# Patient Record
Sex: Female | Born: 1960 | Hispanic: No | Marital: Married | Smoking: Never smoker
Health system: Southern US, Community
[De-identification: ages and names within clinical notes are randomized; demographics above are authoritative.]

## PROBLEM LIST (undated history)

## (undated) DIAGNOSIS — I10 Essential (primary) hypertension: Secondary | ICD-10-CM

## (undated) DIAGNOSIS — E119 Type 2 diabetes mellitus without complications: Secondary | ICD-10-CM

## (undated) DIAGNOSIS — R350 Frequency of micturition: Secondary | ICD-10-CM

## (undated) DIAGNOSIS — F32A Depression, unspecified: Secondary | ICD-10-CM

## (undated) DIAGNOSIS — F329 Major depressive disorder, single episode, unspecified: Secondary | ICD-10-CM

## (undated) DIAGNOSIS — E785 Hyperlipidemia, unspecified: Secondary | ICD-10-CM

## (undated) DIAGNOSIS — M1990 Unspecified osteoarthritis, unspecified site: Secondary | ICD-10-CM

## (undated) HISTORY — DX: Essential (primary) hypertension: I10

## (undated) HISTORY — DX: Frequency of micturition: R35.0

## (undated) HISTORY — DX: Type 2 diabetes mellitus without complications: E11.9

## (undated) HISTORY — DX: Hyperlipidemia, unspecified: E78.5

## (undated) HISTORY — DX: Unspecified osteoarthritis, unspecified site: M19.90

## (undated) HISTORY — DX: Major depressive disorder, single episode, unspecified: F32.9

## (undated) HISTORY — DX: Depression, unspecified: F32.A

---

## 2008-01-06 HISTORY — PX: HERNIA REPAIR: SHX51

## 2009-03-08 ENCOUNTER — Emergency Department: Payer: Self-pay | Admitting: Emergency Medicine

## 2013-05-24 ENCOUNTER — Ambulatory Visit: Payer: Self-pay | Admitting: Internal Medicine

## 2015-07-03 ENCOUNTER — Ambulatory Visit (INDEPENDENT_AMBULATORY_CARE_PROVIDER_SITE_OTHER): Payer: BLUE CROSS/BLUE SHIELD | Admitting: Urology

## 2015-07-03 ENCOUNTER — Encounter: Payer: Self-pay | Admitting: Urology

## 2015-07-03 VITALS — BP 127/87 | HR 109 | Ht 63.0 in | Wt 199.0 lb

## 2015-07-03 DIAGNOSIS — R350 Frequency of micturition: Secondary | ICD-10-CM

## 2015-07-03 DIAGNOSIS — R351 Nocturia: Secondary | ICD-10-CM

## 2015-07-03 LAB — BLADDER SCAN AMB NON-IMAGING

## 2015-07-03 NOTE — Progress Notes (Signed)
07/03/2015 11:42 AM   Sydney Torres 07-Apr-1960 [id number]  Referring provider: Margaretann LovelessNeelam S Nadeau, MD [address] [place], [place] [address]  Chief Complaint  Patient presents with  . Urinary Frequency    New Patient    HPI: Patient is a 55 year old [place] female who presents today as a referral from her primary care physician, Sydney Torres for urinary frequency and nocturia.  Patient states that for the last 15-20 years she has suffered with urinary frequency and nocturia. She states her frequency is so severe that she urinates every 15-30 minutes. Her nocturia is so severe that she is urinating every 30 minutes during the night. She is not experiencing urinary incontinence. She does not have a history of urinary tract infections, gross hematuria or urinary stones. She states that in the remote past that she is had her bladder stretched with Dr. Renda Torres.  UA and urine cultures have been negative through her primary care's office.  She has not tried any treatment modalities other than the bladder stretching 15 years ago for this symptomatology. Her PVR today is 14 mL. She is not had dysuria or suprapubic pain. She has not had recent fevers, chills, nausea or vomiting.  She is a diabetic and her most recent hemoglobin A1c was 7%.  She does not consume caffeinated beverages. She states that she drinks mostly water.   PMH: Past Medical History  Diagnosis Date  . Arthritis   . Depression   . Diabetes (HCC)   . Hypertension   . Hyperlipemia     Surgical History: Past Surgical History  Procedure Laterality Date  . Hernia repair  2010    Home Medications:    Medication List       This list is accurate as of: 07/03/15 11:42 AM.  Always use your most recent med list.               enalapril 10 MG tablet  Commonly known as:  VASOTEC  Take 10 mg by mouth daily.     gemfibrozil 600 MG tablet  Commonly known as:  LOPID  Take 600 mg by mouth 2 (two) times daily before a meal.     meloxicam 15 MG tablet  Commonly known as:  MOBIC  Take 15 mg by mouth daily.     metFORMIN 500 MG (MOD) 24 hr tablet  Commonly known as:  GLUMETZA  Take 500 mg by mouth daily with breakfast.        Allergies: No Known Allergies  Family History: Family History  Problem Relation Age of Onset  . Kidney cancer Neg Hx   . Prostate cancer Neg Hx   . Kidney failure Father   . Kidney failure Mother   . Kidney failure Brother     Social History:  reports that she has never smoked. She does not have any smokeless tobacco history on file. She reports that she does not drink alcohol or use illicit drugs.  ROS: UROLOGY Frequent Urination?: Yes Hard to postpone urination?: No Burning/pain with urination?: No Get up at night to urinate?: Yes Leakage of urine?: No Urine stream starts and stops?: No Trouble starting stream?: No Do you have to strain to urinate?: No Blood in urine?: No Urinary tract infection?: No Sexually transmitted disease?: No Injury to kidneys or bladder?: No Painful intercourse?: No Weak stream?: No Currently pregnant?: No Vaginal bleeding?: No Last menstrual period?: n  Gastrointestinal Nausea?: No Vomiting?: Yes Indigestion/heartburn?: No Diarrhea?: No Constipation?: No  Constitutional  Fever: Yes Night sweats?: No Weight loss?: No Fatigue?: No  Skin Skin rash/lesions?: No Itching?: Yes  Eyes Blurred vision?: No Double vision?: No  Ears/Nose/Throat Sore throat?: No Sinus problems?: No  Hematologic/Lymphatic Swollen glands?: No Easy bruising?: No  Cardiovascular Leg swelling?: No Chest pain?: No  Respiratory Cough?: No Shortness of breath?: No  Endocrine Excessive thirst?: No  Musculoskeletal Back pain?: Yes Joint pain?: Yes  Neurological Headaches?: No Dizziness?: No  Psychologic Depression?: Yes Anxiety?: No  Physical Exam: BP 127/87 mmHg  Pulse 109  Ht 5\' 3"  (1.6 m)  Wt 199 lb (90.266 kg)  BMI 35.26 kg/m2    Constitutional: Well nourished. Alert and oriented, No acute distress. HEENT: [place] AT, moist mucus membranes. Trachea midline, no masses. Cardiovascular: No clubbing, cyanosis, or edema. Respiratory: Normal respiratory effort, no increased work of breathing. GI: Abdomen is soft, non tender, non distended, no abdominal masses. Liver and spleen not palpable.  No hernias appreciated.  Stool sample for occult testing is not indicated.   GU: No CVA tenderness.  No bladder fullness or masses.  Normal external genitalia, normal pubic hair distribution, no lesions.  Normal urethral meatus, no lesions, no prolapse, no discharge.   No urethral masses, tenderness and/or tenderness. No bladder fullness, tenderness or masses. Normal vagina mucosa, good estrogen effect, no discharge, no lesions, good pelvic support, no cystocele or rectocele noted.  No cervical motion tenderness.  Uterus is freely mobile and non-fixed.  No adnexal/parametria masses or tenderness noted.  Anus and perineum are without rashes or lesions.   Skin: No rashes, bruises or suspicious lesions. Lymph: No cervical or inguinal adenopathy. Neurologic: Grossly intact, no focal deficits, moving all 4 extremities. Psychiatric: Normal mood and affect.   Pertinent Imaging: Results for Sydney Torres, Sydney Torres (MRN [id number]) as of 07/03/2015 11:41  Ref. Range 07/03/2015 11:23  Scan Result Unknown 14ml    Assessment & Plan:    1. Urinary frequency:   Patient was offered behavioral therapies; bladder training, bladder control strategies, pelvic floor muscle training and fluid management.  We discussed PTNS, Botox and InterStim.  We also discussed anticholinergic therapy and beta-3 andrenoceptor agonist and the potential side effects of each therapy.   She would like to try the beta-3 andrenoceptor (Myrbetriq).  I have given her Myrbetriq 50 mg samples, #28.  I have reviewed with the patient of the side effects of Myrbetriq, such as: elevation in BP, urinary  retention and/or HA.  She will return in 3 weeks for PVR and symptom recheck.    - BLADDER SCAN AMB NON-IMAGING  2. Nocturia:   See above.   Return in about 3 weeks (around 07/24/2015) for PVR and symptom recheck.  These notes generated with voice recognition software. I apologize for typographical errors.  Michiel CowboySHANNON Kanisha Duba, PA-C  [facility] [address], [address] [place], [place] [address] [phone]

## 2015-07-03 NOTE — Patient Instructions (Signed)
Mirabegron extended-release tablets What is this medicine? MIRABEGRON (MIR a BEG ron) is used to treat overactive bladder. This medicine reduces the amount of bathroom visits. It may also help to control wetting accidents. This medicine may be used for other purposes; ask your health care provider or pharmacist if you have questions. What should I tell my health care provider before I take this medicine? They need to know if you have any of these conditions: -difficulty passing urine -high blood pressure -kidney disease -liver disease -an unusual or allergic reaction to mirabegron, other medicines, foods, dyes, or preservatives -pregnant or trying to get pregnant -breast-feeding How should I use this medicine? Take this medicine by mouth with a glass of water. Follow the directions on the prescription label. Do not cut, crush or chew this medicine. You can take it with or without food. If it upsets your stomach, take it with food. Take your medicine at regular intervals. Do not take it more often than directed. Do not stop taking except on your doctor's advice. Talk to your pediatrician regarding the use of this medicine in children. Special care may be needed. Overdosage: If you think you have taken too much of this medicine contact a poison control center or emergency room at once. NOTE: This medicine is only for you. Do not share this medicine with others. What if I miss a dose? If you miss a dose, take it as soon as you can. If it is almost time for your next dose, take only that dose. Do not take double or extra doses. What may interact with this medicine? -certain medicines for bladder problems like fesoterodine, oxybutynin, solifenacin, tolterodine -desipramine -digoxin -flecainide -ketoconazole -MAOIs like Carbex, Eldepryl, Marplan, Nardil, and Parnate -metoprolol -propafenone -thioridazine -warfarin This list may not describe all possible interactions. Give your health care  provider a list of all the medicines, herbs, non-prescription drugs, or dietary supplements you use. Also tell them if you smoke, drink alcohol, or use illegal drugs. Some items may interact with your medicine. What should I watch for while using this medicine? It may take 8 weeks to notice the full benefit from this medicine. You may need to limit your intake tea, coffee, caffeinated sodas, and alcohol. These drinks may make your symptoms worse. Visit your doctor or health care professional for regular checks on your progress. Check your blood pressure as directed. Ask your doctor or health care professional what your blood pressure should be and when you should contact him or her. What side effects may I notice from receiving this medicine? Side effects that you should report to your doctor or health care professional as soon as possible: -allergic reactions like skin rash, itching or hives, swelling of the face, lips, or tongue -chest pain or palpitations -severe or sudden headache -high blood pressure -fast, irregular heartbeat -redness, blistering, peeling or loosening of the skin, including inside the mouth -signs of infection like fever or chills; cough; sore throat; pain or difficulty passing urine -trouble passing urine or change in the amount of urine Side effects that usually do not require medical attention (Report these to your doctor or health care professional if they continue or are bothersome.): -constipation -diarrhea -dizziness -dry eyes -joint pain -mild headache -nausea -runny nose This list may not describe all possible side effects. Call your doctor for medical advice about side effects. You may report side effects to FDA at 1-800-FDA-1088. Where should I keep my medicine? Keep out of the reach of children. Store   at room temperature between 15 and 30 degrees C (59 and 86 degrees F). Throw away any unused medicine after the expiration date. NOTE: This sheet is a  summary. It may not cover all possible information. If you have questions about this medicine, talk to your doctor, pharmacist, or health care provider.    2016, Elsevier/Gold Standard. (2014-08-23 10:22:20)  

## 2015-07-26 ENCOUNTER — Encounter: Payer: Self-pay | Admitting: Urology

## 2015-07-26 ENCOUNTER — Ambulatory Visit (INDEPENDENT_AMBULATORY_CARE_PROVIDER_SITE_OTHER): Payer: BLUE CROSS/BLUE SHIELD | Admitting: Urology

## 2015-07-26 VITALS — BP 112/73 | HR 89 | Ht 63.0 in | Wt 197.9 lb

## 2015-07-26 DIAGNOSIS — R350 Frequency of micturition: Secondary | ICD-10-CM | POA: Diagnosis not present

## 2015-07-26 DIAGNOSIS — R351 Nocturia: Secondary | ICD-10-CM

## 2015-07-26 LAB — BLADDER SCAN AMB NON-IMAGING: SCAN RESULT: 0

## 2015-07-26 NOTE — Progress Notes (Signed)
10:41 AM   Sydney Torres [birth year] [id number]  Referring provider: [name], MD [address] [place], [place] [address]  Chief Complaint  Patient presents with  . Urinary Frequency    3 week follow up  . Nocturia    HPI: Patient is a 55 year old [place] female who presents today for recheck of her urinary symptoms after being placed on Myrbetriq 50 mg daily.  Background history Patient was a referral from her primary care physician, Sydney Torres for urinary frequency and nocturia.  Patient states that for the last 15-20 years she has suffered with urinary frequency and nocturia. She states her frequency is so severe that she urinates every 15-30 minutes. Her nocturia is so severe that she is urinating every 30 minutes during the night. She is not experiencing urinary incontinence. She does not have a history of urinary tract infections, gross hematuria or urinary stones. She states that in the remote past that she is had her bladder stretched with Dr. [name].  UA and urine cultures have been negative through her primary care's office.  She has not tried any treatment modalities other than the bladder stretching 15 years ago for this symptomatology. Her PVR 3 weeks ago was 14 mL. She is not had dysuria or suprapubic pain. She has not had recent fevers, chills, nausea or vomiting.  She is a diabetic and her most recent hemoglobin A1c was 7%.  She does not consume caffeinated beverages. She states that she drinks mostly water.  After a 3 week trial of Myrbetriq 50 mg daily, she states that she has seen some mild benefit.  She has noted most of her improvement has been during the day.  She is still experiencing urgency and nocturia.  She states that nocturia is still persisting, she states that she may be going every hour now during the night which is an improvement from every 30 minutes.  The frequency and urgency during the day, she has noted she is able to hold it for several more minutes  that in the past.  PVR today is 0 mL. She has not experienced dysuria, suprapubic pain or gross hematuria. She is not experiencing fever, chills, nausea or vomiting.   PMH: Past Medical History  Diagnosis Date  . Arthritis   . Depression   . Diabetes (HCC)   . Hypertension   . Hyperlipemia   . Urinary frequency     Surgical History: Past Surgical History  Procedure Laterality Date  . Hernia repair  2010    Home Medications:    Medication List       This list is accurate as of: 07/26/15 10:41 AM.  Always use your most recent med list.               enalapril 10 MG tablet  Commonly known as:  VASOTEC  Take 10 mg by mouth daily.     gemfibrozil 600 MG tablet  Commonly known as:  LOPID  Take 600 mg by mouth 2 (two) times daily before a meal.     meloxicam 15 MG tablet  Commonly known as:  MOBIC  Take 15 mg by mouth daily. Reported on 07/26/2015     metFORMIN 500 MG (MOD) 24 hr tablet  Commonly known as:  GLUMETZA  Take 500 mg by mouth daily with breakfast.     MYRBETRIQ 50 MG Tb24 tablet  Generic drug:  mirabegron ER  Take 50 mg by mouth daily.  rosuvastatin 5 MG tablet  Commonly known as:  CRESTOR  Take 5 mg by mouth daily.        Allergies: No Known Allergies  Family History: Family History  Problem Relation Age of Onset  . Kidney cancer Neg Hx   . Prostate cancer Neg Hx   . Kidney failure Father   . Kidney failure Mother   . Kidney failure Brother     Social History:  reports that she has never smoked. She does not have any smokeless tobacco history on file. She reports that she does not drink alcohol or use illicit drugs.  ROS: UROLOGY Frequent Urination?: No Hard to postpone urination?: Yes Burning/pain with urination?: No Get up at night to urinate?: Yes Leakage of urine?: No Urine stream starts and stops?: No Trouble starting stream?: No Do you have to strain to urinate?: No Blood in urine?: No Urinary tract infection?:  No Sexually transmitted disease?: No Injury to kidneys or bladder?: No Painful intercourse?: No Weak stream?: No Currently pregnant?: No Vaginal bleeding?: No Last menstrual period?: n  Gastrointestinal Nausea?: No Vomiting?: No Indigestion/heartburn?: No Diarrhea?: No Constipation?: No  Constitutional Fever: No Night sweats?: No Weight loss?: No Fatigue?: No  Skin Skin rash/lesions?: Yes Itching?: Yes  Eyes Blurred vision?: No Double vision?: No  Ears/Nose/Throat Sore throat?: No Sinus problems?: No  Hematologic/Lymphatic Swollen glands?: No Easy bruising?: No  Cardiovascular Leg swelling?: No Chest pain?: No  Respiratory Cough?: No Shortness of breath?: No  Endocrine Excessive thirst?: No  Musculoskeletal Back pain?: No Joint pain?: Yes  Neurological Headaches?: No Dizziness?: No  Psychologic Depression?: No Anxiety?: No  Physical Exam: BP 112/73 mmHg  Pulse 89  Ht  (1.6 m)  Wt 197 lb 14.4 oz (89.767 kg)  BMI 35.07 kg/m2  Constitutional: Well nourished. Alert and oriented, No acute distress. HEENT: [name], moist mucus membranes. Trachea midline, no masses. Cardiovascular: No clubbing, cyanosis, or edema. Respiratory: Normal respiratory effort, no increased work of breathing. Skin: No rashes, bruises or suspicious lesions. Lymph: No cervical or inguinal adenopathy. Neurologic: Grossly intact, no focal deficits, moving all 4 extremities. Psychiatric: Normal mood and affect.   Pertinent Imaging: Results for [name], [name] (MRN [id number]) as of 07/27/2015 16:29  Ref. Range 07/26/2015 10:25  Scan Result Unknown 0   Assessment & Plan:    1. Urinary frequency:   Patient is encouraged with the response that she's had with her Myrbetriq so far.  She would like to continue that medication, but she is fearful that it'll be cost prohibitive. I have given her 2 months of Myrbetriq 50 mg samples. She will return in 2 months for PVR and symptom  recheck.  If she finds that the Myrbetriq continues to have benefit, we'll pursue avenues to see if we can get this medication coverage for her.  - BLADDER SCAN AMB NON-IMAGING  2. Nocturia:   See above.   Return in about 2 months (around 09/26/2015) for PVR .  These notes generated with voice recognition software. I apologize for typographical errors.  Michiel Cowboy, PA-C  [facility] [address] [place], [place] [address] [phone]

## 2015-09-19 ENCOUNTER — Other Ambulatory Visit: Payer: Self-pay | Admitting: Internal Medicine

## 2015-09-19 DIAGNOSIS — Z1231 Encounter for screening mammogram for malignant neoplasm of breast: Secondary | ICD-10-CM

## 2015-09-26 ENCOUNTER — Ambulatory Visit (INDEPENDENT_AMBULATORY_CARE_PROVIDER_SITE_OTHER): Payer: BLUE CROSS/BLUE SHIELD | Admitting: Urology

## 2015-09-26 ENCOUNTER — Encounter: Payer: Self-pay | Admitting: Urology

## 2015-09-26 VITALS — BP 115/76 | HR 83 | Ht 63.0 in | Wt 195.4 lb

## 2015-09-26 DIAGNOSIS — R350 Frequency of micturition: Secondary | ICD-10-CM | POA: Diagnosis not present

## 2015-09-26 DIAGNOSIS — R351 Nocturia: Secondary | ICD-10-CM

## 2015-09-26 LAB — BLADDER SCAN AMB NON-IMAGING: SCAN RESULT: 16

## 2015-09-26 NOTE — Progress Notes (Signed)
10:49 AM   Sydney BeardsRazia Torres 12-Mar-1960 [id number]  Referring provider: Margaretann LovelessNeelam S Sider, MD [address] [place], [place] [address]  Chief Complaint  Patient presents with  . Urinary Frequency    2 month follow up  . Nocturia    HPI: Patient is a 55 year old [place] female who presents today for recheck of her urinary symptoms after being placed on Myrbetriq 50 mg daily.  Background history Patient was a referral from her primary care physician, Dr. [name] for urinary frequency and nocturia.  Patient states that for the last 15-20 years she has suffered with urinary frequency and nocturia. She states her frequency is so severe that she urinates every 15-30 minutes. Her nocturia is so severe that she is urinating every 30 minutes during the night. She is not experiencing urinary incontinence. She does not have a history of urinary tract infections, gross hematuria or urinary stones. She states that in the remote past that she is had her bladder stretched with Dr. [name].  UA and urine cultures have been negative through her primary care's office.  She has not tried any treatment modalities other than the bladder stretching 15 years ago for this symptomatology. Her PVR 3 weeks ago was 14 mL. She is not had dysuria or suprapubic pain. She has not had recent fevers, chills, nausea or vomiting.  She is a diabetic and her most recent hemoglobin A1c was 7%.  She does not consume caffeinated beverages. She states that she drinks mostly water.  Interval history After a 3 week trial of Myrbetriq 50 mg daily, she states that she has seen some mild benefit.  She has noted most of her improvement has been during the day.  She is still experiencing urgency and nocturia.  She states that nocturia is still persisting, she states that she may be going every hour now during the night which is an improvement from every 30 minutes.  The frequency and urgency during the day, she has noted she is able to hold it for  several more minutes that in the past.  PVR today is 0 mL. She has not experienced dysuria, suprapubic pain or gross hematuria. She is not experiencing fever, chills, nausea or vomiting.  Today, she states the Myrbetriq is no longer effective.  She states after her visit with us 3 weeks ago, her urinary frequency and urgency returned.  They are both back to baseline.  She is having daytime frequency of every 15-30 minutes and nighttime frequency of every 30 minutes.  He is not experiencing dysuria, gross hematuria or suprapubic pain. She is not experiencing fevers, chills, nausea or vomiting. She states that she only drinks water.  Her PVR today is 16 mL.   PMH: Past Medical History:  Diagnosis Date  . Arthritis   . Depression   . Diabetes (HCC)   . Hyperlipemia   . Hypertension   . Urinary frequency     Surgical History: Past Surgical History:  Procedure Laterality Date  . HERNIA REPAIR  2010    Home Medications:    Medication List       Accurate as of 09/26/15 10:49 AM. Always use your most recent med list.          enalapril 10 MG tablet Commonly known as:  VASOTEC Take 10 mg by mouth daily.   gemfibrozil 600 MG tablet Commonly known as:  LOPID Take 600 mg by mouth 2 (two) times daily before a meal.   meloxicam 15  MG tablet Commonly known as:  MOBIC Take 15 mg by mouth daily. Reported on 07/26/2015   metFORMIN 500 MG (MOD) 24 hr tablet Commonly known as:  GLUMETZA Take 500 mg by mouth daily with breakfast.   MYRBETRIQ 50 MG Tb24 tablet Generic drug:  mirabegron ER Take 50 mg by mouth daily.   rosuvastatin 5 MG tablet Commonly known as:  CRESTOR Take 5 mg by mouth daily.       Allergies: No Known Allergies  Family History: Family History  Problem Relation Age of Onset  . Kidney failure Father   . Kidney failure Mother   . Kidney failure Brother   . Kidney cancer Neg Hx   . Prostate cancer Neg Hx     Social History:  reports that she has never  smoked. She does not have any smokeless tobacco history on file. She reports that she does not drink alcohol or use drugs.  ROS: UROLOGY Frequent Urination?: Yes Hard to postpone urination?: Yes Burning/pain with urination?: No Get up at night to urinate?: No Leakage of urine?: No Urine stream starts and stops?: No Trouble starting stream?: No Do you have to strain to urinate?: No Blood in urine?: No Urinary tract infection?: No Sexually transmitted disease?: No Injury to kidneys or bladder?: No Painful intercourse?: No Weak stream?: No Currently pregnant?: No Vaginal bleeding?: No Last menstrual period?: n  Gastrointestinal Nausea?: No Vomiting?: No Indigestion/heartburn?: No Diarrhea?: No Constipation?: No  Constitutional Fever: No Night sweats?: No Weight loss?: No Fatigue?: No  Skin Skin rash/lesions?: Yes Itching?: Yes  Eyes Blurred vision?: No Double vision?: No  Ears/Nose/Throat Sore throat?: No Sinus problems?: No  Hematologic/Lymphatic Swollen glands?: No Easy bruising?: No  Cardiovascular Leg swelling?: No Chest pain?: No  Respiratory Cough?: No Shortness of breath?: No  Endocrine Excessive thirst?: No  Musculoskeletal Back pain?: No Joint pain?: No  Neurological Headaches?: No Dizziness?: No  Psychologic Depression?: No Anxiety?: No  Physical Exam: BP 115/76   Pulse 83   Ht 5\' 3"  (1.6 m)   Wt 195 lb 6.4 oz (88.6 kg)   BMI 34.61 kg/m   Constitutional: Well nourished. Alert and oriented, No acute distress. HEENT: [place] AT, moist mucus membranes. Trachea midline, no masses. Cardiovascular: No clubbing, cyanosis, or edema. Respiratory: Normal respiratory effort, no increased work of breathing. Skin: No rashes, bruises or suspicious lesions. Lymph: No cervical or inguinal adenopathy. Neurologic: Grossly intact, no focal deficits, moving all 4 extremities. Psychiatric: Normal mood and affect.   Pertinent Imaging: Results  for KIYANI, JERNIGAN (MRN [id number]) as of 10/01/2015 21:29  Ref. Range 09/26/2015 10:33  Scan Result Unknown 16    Assessment & Plan:    1. Urinary frequency  - failed beta-3 adrenergic receptor agonist  - offered behavioral therapies; bladder training, bladder control strategies, pelvic floor muscle training and fluid management- patient will be traveling out of the country to care for an ill family member    - offered medical therapy with anticholinergic therapy and the potential side effects of anticholinergics  - would like to try anticholinergic therapy.  Given Vesicare 10mg ,samples, # 28.   Advised of the side effects, such as: Dry eyes, dry mouth, constipation, mental confusion and/or urinary retention.   - RTC in 3 weeks for PVR and symptom recheck   - BLADDER SCAN AMB NON-IMAGING  2. Nocturia:   See above.   Return in about 3 weeks (around 10/17/2015) for PVR.  These notes generated with voice recognition software. I  apologize for typographical errors.  Zara Council, [facility] [address] [place], [place] [address] [phone]

## 2015-09-27 ENCOUNTER — Other Ambulatory Visit: Payer: Self-pay | Admitting: Internal Medicine

## 2015-09-27 DIAGNOSIS — N6310 Unspecified lump in the right breast, unspecified quadrant: Secondary | ICD-10-CM

## 2015-10-02 ENCOUNTER — Inpatient Hospital Stay
Admission: RE | Admit: 2015-10-02 | Discharge: 2015-10-02 | Disposition: A | Discharge status: Home / Self Care | Payer: Self-pay | Source: Ambulatory Visit | Attending: *Deleted | Admitting: *Deleted

## 2015-10-02 ENCOUNTER — Other Ambulatory Visit: Payer: Self-pay | Admitting: *Deleted

## 2015-10-02 DIAGNOSIS — Z9289 Personal history of other medical treatment: Secondary | ICD-10-CM

## 2015-10-04 ENCOUNTER — Other Ambulatory Visit: Payer: Self-pay | Admitting: Internal Medicine

## 2015-10-04 DIAGNOSIS — N6310 Unspecified lump in the right breast, unspecified quadrant: Secondary | ICD-10-CM

## 2015-10-17 ENCOUNTER — Encounter: Payer: Self-pay | Admitting: Urology

## 2015-10-17 ENCOUNTER — Ambulatory Visit (INDEPENDENT_AMBULATORY_CARE_PROVIDER_SITE_OTHER): Payer: BLUE CROSS/BLUE SHIELD | Admitting: Urology

## 2015-10-17 VITALS — BP 116/76 | HR 84 | Ht 63.0 in | Wt 190.0 lb

## 2015-10-17 DIAGNOSIS — R351 Nocturia: Secondary | ICD-10-CM | POA: Diagnosis not present

## 2015-10-17 DIAGNOSIS — R350 Frequency of micturition: Secondary | ICD-10-CM

## 2015-10-17 LAB — BLADDER SCAN AMB NON-IMAGING: Scan Result: 46

## 2015-10-17 NOTE — Progress Notes (Signed)
10:34 AM   Sydney Torres [birth year] [id number]  Referring provider: [name], MD [address] [place], [place] [address]  Chief Complaint  Patient presents with  . Follow-up    3 weeks urinary frequency and nocturia    HPI: Patient is a 55 year old [place] female who presents today for recheck of her urinary symptoms after being placed on Vesicare 10 mg.  Background history Patient was a referral from her primary care physician, Sydney Torres for urinary frequency and nocturia.  Patient states that for the last 15-20 years she has suffered with urinary frequency and nocturia. She stated her frequency is so severe that she urinates every 15-30 minutes. Her nocturia is also so severe that she is urinating every 30 minutes during the night. She is not experiencing urinary incontinence. She does not have a history of urinary tract infections, gross hematuria or urinary stones. She states that in the remote past that she is had her bladder stretched with Sydney Torres.  UA and urine cultures have been negative through her primary care's office.  She has not tried any treatment modalities other than the bladder stretching 15 years ago for this symptomatology. Her PVR 3 weeks ago was 14 mL. She is not had dysuria or suprapubic pain. She has not had recent fevers, chills, nausea or vomiting.  She is a diabetic and her most recent hemoglobin A1c was 7%.  She does not consume caffeinated beverages. She states that she drinks mostly water.  Interval history After a 3 week trial of Myrbetriq 50 mg daily, she states that she has seen some mild benefit.  She has noted most of her improvement has been during the day.  She is still experiencing urgency and nocturia.  She states that nocturia is still persisting, she states that she may be going every hour now during the night which is an improvement from every 30 minutes.  The frequency and urgency during the day, she has noted she is able to hold it for  several more minutes that in the past.  PVR today is 0 mL. She has not experienced dysuria, suprapubic pain or gross hematuria. She is not experiencing fever, chills, nausea or vomiting. Three weeks later, she stated the Myrbetriq is no longer effective.  She stated her urinary frequency and urgency had returned.  They are both back to baseline.  She is having daytime frequency of every 15-30 minutes and nighttime frequency of every 30 minutes.  She was not experiencing dysuria, gross hematuria or suprapubic pain. She was not experiencing fevers, chills, nausea or vomiting. She states that she only drinks water.  Her PVR is 16 mL.  Today, she states the Vesicare provided no relief or reduction in her symptoms.  She is having a great deal of bother with urgency and nocturia.  She is not experiencing dysuria, gross hematuria or suprapubic pain. She is not experiencing fevers, chills, nausea or vomiting.  Her PVR today's 46 mL.   PMH: Past Medical History:  Diagnosis Date  . Arthritis   . Depression   . Diabetes (HCC)   . Hyperlipemia   . Hypertension   . Urinary frequency     Surgical History: Past Surgical History:  Procedure Laterality Date  . HERNIA REPAIR  2010    Home Medications:    Medication List       Accurate as of 10/17/15 10:34 AM. Always use your most recent med list.  enalapril 10 MG tablet Commonly known as:  VASOTEC Take 10 mg by mouth daily.   gemfibrozil 600 MG tablet Commonly known as:  LOPID Take 600 mg by mouth 2 (two) times daily before a meal.   meloxicam 15 MG tablet Commonly known as:  MOBIC Take 15 mg by mouth daily. Reported on 07/26/2015   metFORMIN 500 MG (MOD) 24 hr tablet Commonly known as:  GLUMETZA Take 500 mg by mouth daily with breakfast.   MYRBETRIQ 50 MG Tb24 tablet Generic drug:  mirabegron ER Take 50 mg by mouth daily.   rosuvastatin 5 MG tablet Commonly known as:  CRESTOR Take 5 mg by mouth daily.        Allergies: No Known Allergies  Family History: Family History  Problem Relation Age of Onset  . Kidney failure Father   . Kidney failure Mother   . Kidney failure Brother   . Kidney cancer Neg Hx   . Prostate cancer Neg Hx     Social History:  reports that she has never smoked. She does not have any smokeless tobacco history on file. She reports that she does not drink alcohol or use drugs.  ROS: UROLOGY Frequent Urination?: No Hard to postpone urination?: Yes Burning/pain with urination?: No Get up at night to urinate?: No Leakage of urine?: No Urine stream starts and stops?: No Trouble starting stream?: No Do you have to strain to urinate?: No Blood in urine?: No Urinary tract infection?: No Sexually transmitted disease?: No Injury to kidneys or bladder?: No Painful intercourse?: No Weak stream?: No Currently pregnant?: No Vaginal bleeding?: No Last menstrual period?: n  Gastrointestinal Nausea?: No Vomiting?: No Indigestion/heartburn?: No Diarrhea?: No Constipation?: Yes  Constitutional Fever: No Night sweats?: No Weight loss?: No Fatigue?: No  Skin Skin rash/lesions?: No Itching?: No  Eyes Blurred vision?: No Double vision?: No  Ears/Nose/Throat Sore throat?: No Sinus problems?: No  Hematologic/Lymphatic Swollen glands?: No Easy bruising?: No  Cardiovascular Leg swelling?: No Chest pain?: No  Respiratory Cough?: No Shortness of breath?: No  Endocrine Excessive thirst?: No  Musculoskeletal Back pain?: No Joint pain?: No  Neurological Headaches?: No Dizziness?: No  Psychologic Depression?: No Anxiety?: No  Physical Exam: BP 116/76   Pulse 84   Ht 5\' 3"  (1.6 m)   Wt 190 lb (86.2 kg)   BMI 33.66 kg/m   Constitutional: Well nourished. Alert and oriented, No acute distress. HEENT: [place] AT, moist mucus membranes. Trachea midline, no masses. Cardiovascular: No clubbing, cyanosis, or edema. Respiratory: Normal  respiratory effort, no increased work of breathing. Skin: No rashes, bruises or suspicious lesions. Lymph: No cervical or inguinal adenopathy. Neurologic: Grossly intact, no focal deficits, moving all 4 extremities. Psychiatric: Normal mood and affect.   Pertinent Imaging: Results for Sydney Torres (MRN [id number]) as of 10/27/2015 19:10  Ref. Range 10/17/2015 10:07  Scan Result Unknown 46     Assessment & Plan:    1. Urinary frequency  - failed beta-3 adrenergic receptor agonist and anticholinergic therapy  - offered behavioral therapies; bladder training, bladder control strategies, pelvic floor muscle training and fluid management- patient will be traveling out of the country to care for an ill family member and does not wish to pursue any of these modalities at this time  - offered PTNS, Botox and PNE- she is not interested in any of these therapies at this time   - patient would like to try another medication even though I have expressed my concerns that it most  likely will not be effective- she is given Toviaz 8 mg samples  - RTC in 3 weeks for PVR and symptom recheck   - BLADDER SCAN AMB NON-IMAGING  2. Nocturia:   See above.   Return in about 3 weeks (around 11/07/2015) for PVR and symptom recheck.  These notes generated with voice recognition software. I apologize for typographical errors.  Michiel CowboySHANNON Karron Alvizo, PA-C  [facility] [address], [address] [place], [place] [address] [phone]

## 2015-10-18 ENCOUNTER — Ambulatory Visit
Admission: RE | Admit: 2015-10-18 | Discharge: 2015-10-18 | Disposition: A | Discharge status: Home / Self Care | Payer: BLUE CROSS/BLUE SHIELD | Source: Ambulatory Visit | Attending: Internal Medicine | Admitting: Internal Medicine

## 2015-10-18 DIAGNOSIS — R928 Other abnormal and inconclusive findings on diagnostic imaging of breast: Secondary | ICD-10-CM | POA: Diagnosis not present

## 2015-10-18 DIAGNOSIS — N63 Unspecified lump in unspecified breast: Secondary | ICD-10-CM | POA: Diagnosis present

## 2015-10-18 DIAGNOSIS — N6310 Unspecified lump in the right breast, unspecified quadrant: Secondary | ICD-10-CM

## 2015-11-06 ENCOUNTER — Ambulatory Visit: Payer: BLUE CROSS/BLUE SHIELD | Admitting: Urology

## 2015-11-07 ENCOUNTER — Ambulatory Visit: Payer: BLUE CROSS/BLUE SHIELD | Admitting: Urology

## 2015-11-13 ENCOUNTER — Ambulatory Visit (INDEPENDENT_AMBULATORY_CARE_PROVIDER_SITE_OTHER): Payer: BLUE CROSS/BLUE SHIELD | Admitting: Urology

## 2015-11-13 ENCOUNTER — Encounter: Payer: Self-pay | Admitting: Urology

## 2015-11-13 VITALS — BP 105/70 | HR 102 | Ht 63.0 in | Wt 187.9 lb

## 2015-11-13 DIAGNOSIS — R351 Nocturia: Secondary | ICD-10-CM

## 2015-11-13 DIAGNOSIS — R350 Frequency of micturition: Secondary | ICD-10-CM | POA: Diagnosis not present

## 2015-11-13 LAB — BLADDER SCAN AMB NON-IMAGING: SCAN RESULT: 48

## 2015-11-13 NOTE — Progress Notes (Signed)
4:11 PM   Sydney Torres 1960/08/25 [id number]  Referring provider: Margaretann LovelessNeelam S Sawdey, MD [address] [place], [place] [address]  Chief Complaint  Patient presents with  . Urinary Frequency    3 week follow up    HPI: Patient is a 55 year old [place] female who presents today for recheck of her urinary symptoms after being placed on Toviaz 8 mg.  Background history Patient was a referral from her primary care physician, Dr. [name] for urinary frequency and nocturia.  Patient states that for the last 15-20 years she has suffered with urinary frequency and nocturia. She stated her frequency is so severe that she urinates every 15-30 minutes. Her nocturia is also so severe that she is urinating every 30 minutes during the night. She is not experiencing urinary incontinence. She does not have a history of urinary tract infections, gross hematuria or urinary stones. She states that in the remote past that she is had her bladder stretched with Dr. [name].  UA and urine cultures have been negative through her primary care's office.  She has not tried any treatment modalities other than the bladder stretching 15 years ago for this symptomatology. Her PVR 3 weeks ago was 14 mL. She is not had dysuria or suprapubic pain. She has not had recent fevers, chills, nausea or vomiting.  She is a diabetic and her most recent hemoglobin A1c was 7%.  She does not consume caffeinated beverages. She states that she drinks mostly water.  Interval history After a 3 week trial of Myrbetriq 50 mg daily, she states that she has seen some mild benefit.  She has noted most of her improvement has been during the day.  She is still experiencing urgency and nocturia.  She states that nocturia is still persisting, she states that she may be going every hour now during the night which is an improvement from every 30 minutes.  The frequency and urgency during the day, she has noted she is able to hold it for several more minutes  that in the past.  PVR today is 0 mL. She has not experienced dysuria, suprapubic pain or gross hematuria. She is not experiencing fever, chills, nausea or vomiting. Three weeks later, she stated the Myrbetriq is no longer effective.  She stated her urinary frequency and urgency had returned.  They are both back to baseline.  She is having daytime frequency of every 15-30 minutes and nighttime frequency of every 30 minutes.  She was not experiencing dysuria, gross hematuria or suprapubic pain. She was not experiencing fevers, chills, nausea or vomiting. She states that she only drinks water.  Her PVR is 16 mL.  She was then started on Vesicare which provided no relief or reduction in her symptoms.  She is having a great deal of bother with urgency and nocturia.  She is not experiencing dysuria, gross hematuria or suprapubic pain. She is not experiencing fevers, chills, nausea or vomiting.  Her PVR was 46 mL.  She was then given a trial of Toviaz 8 mg daily.  She states the Gala Murdochoviaz is helping some what.  She is bothered her very great deal and nighttime urination, she is bothered a great deal with uncomfortable urge to urinate, the sudden urge to urinate with little or no warning and waking up at night due to the need to urinate.  [place] be [place] to [place] soon to care for an ill family member and will be gone for several weeks.  We'll  continue the Gala Murdoch for this time and we will reassess when she returns in January.  Her PVR today is 48 mL.     PMH: Past Medical History:  Diagnosis Date  . Arthritis   . Depression   . Diabetes (HCC)   . Hyperlipemia   . Hypertension   . Urinary frequency     Surgical History: Past Surgical History:  Procedure Laterality Date  . HERNIA REPAIR  2010    Home Medications:    Medication List       Accurate as of 11/13/15 11:59 PM. Always use your most recent med list.          enalapril 10 MG tablet Commonly known as:  VASOTEC Take 10 mg by  mouth daily.   gemfibrozil 600 MG tablet Commonly known as:  LOPID Take 600 mg by mouth 2 (two) times daily before a meal.   meloxicam 15 MG tablet Commonly known as:  MOBIC Take 15 mg by mouth daily. Reported on 07/26/2015   metFORMIN 500 MG (MOD) 24 hr tablet Commonly known as:  GLUMETZA Take 500 mg by mouth daily with breakfast.   MYRBETRIQ 50 MG Tb24 tablet Generic drug:  mirabegron ER Take 50 mg by mouth daily.   rosuvastatin 5 MG tablet Commonly known as:  CRESTOR Take 5 mg by mouth daily.   TOVIAZ 8 MG Tb24 tablet Generic drug:  fesoterodine Take 8 mg by mouth daily.       Allergies: No Known Allergies  Family History: Family History  Problem Relation Age of Onset  . Kidney failure Father   . Kidney failure Mother   . Kidney failure Brother   . Kidney cancer Neg Hx   . Prostate cancer Neg Hx     Social History:  reports that she has never smoked. She has never used smokeless tobacco. She reports that she does not drink alcohol or use drugs.  ROS: UROLOGY Frequent Urination?: No Hard to postpone urination?: No Burning/pain with urination?: No Get up at night to urinate?: Yes Leakage of urine?: No Urine stream starts and stops?: No Trouble starting stream?: No Do you have to strain to urinate?: No Blood in urine?: No Urinary tract infection?: No Sexually transmitted disease?: No Injury to kidneys or bladder?: No Painful intercourse?: No Weak stream?: No Currently pregnant?: No Vaginal bleeding?: No Last menstrual period?: n  Gastrointestinal Nausea?: No Vomiting?: No Indigestion/heartburn?: No Diarrhea?: No Constipation?: No  Constitutional Fever: No Night sweats?: No Weight loss?: No Fatigue?: No  Skin Skin rash/lesions?: No Itching?: No  Eyes Blurred vision?: No Double vision?: No  Ears/Nose/Throat Sore throat?: No Sinus problems?: No  Hematologic/Lymphatic Swollen glands?: No Easy bruising?: No  Cardiovascular Leg  swelling?: No Chest pain?: No  Respiratory Cough?: No Shortness of breath?: No  Endocrine Excessive thirst?: No  Musculoskeletal Back pain?: No Joint pain?: No  Neurological Headaches?: No Dizziness?: No  Psychologic Depression?: No Anxiety?: No  Physical Exam: BP 105/70   Pulse (!) 102   Ht 5\' 3"  (1.6 m)   Wt 187 lb 14.4 oz (85.2 kg)   BMI 33.28 kg/m   Constitutional: Well nourished. Alert and oriented, No acute distress. HEENT: [place] AT, moist mucus membranes. Trachea midline, no masses. Cardiovascular: No clubbing, cyanosis, or edema. Respiratory: Normal respiratory effort, no increased work of breathing. Skin: No rashes, bruises or suspicious lesions. Lymph: No cervical or inguinal adenopathy. Neurologic: Grossly intact, no focal deficits, moving all 4 extremities. Psychiatric: Normal mood and affect.  Pertinent Imaging: Results for Sydney BeardsKHAN, Sydney Torres (MRN [id number]) as of 11/24/2015 16:09  Ref. Range 11/13/2015 14:22  Scan Result Unknown 48      Assessment & Plan:    1. Urinary frequency  - failed beta-3 adrenergic receptor agonist and anticholinergic therapy  - offered behavioral therapies; bladder training, bladder control strategies, pelvic floor muscle training and fluid management- patient will be traveling out of the country to care for an ill family member and does not wish to pursue any of these modalities at this time-Reassess when she returns in January  - offered PTNS, Botox and PNE- she is not interested in any of these therapies at this time   - patient would like to try another medication even though I have expressed my concerns that it most likely will not be effective- she will continue Toviaz 8 mg until she returns in January  - BLADDER SCAN AMB NON-IMAGING  2. Nocturia:   See above.   Return for in January.  These notes generated with voice recognition software. I apologize for typographical errors.  Michiel CowboySHANNON Shambhavi Salley, PA-C  [facility] [address], [address] [place], [place] [address] [phone]

## 2016-03-29 NOTE — Progress Notes (Signed)
11:02 AM   Sydney Torres 09-Jun-1960 [id number]  Referring provider: [name], MD [address] [place], [place] [address]  Chief Complaint  Patient presents with  . Urinary Frequency    4 month follow up   . Nocturia    HPI: Patient is a 56 year old [place] female who presents today for recheck of her urinary symptoms after being placed on Toviaz 8 mg.  Background history Patient was a referral from her primary care physician, Dr. [name] for urinary frequency and nocturia.  Patient states that for the last 15-20 years she has suffered with urinary frequency and nocturia. She stated her frequency is so severe that she urinates every 15-30 minutes. Her nocturia is also so severe that she is urinating every 30 minutes during the night. She is not experiencing urinary incontinence. She does not have a history of urinary tract infections, gross hematuria or urinary stones. She states that in the remote past that she is had her bladder stretched with Dr. [name].  UA and urine cultures have been negative through her primary care's office.  She has not tried any treatment modalities other than the bladder stretching 15 years ago for this symptomatology. Her PVR 3 weeks ago was 14 mL. She is not had dysuria or suprapubic pain. She has not had recent fevers, chills, nausea or vomiting.  She is a diabetic and her most recent hemoglobin A1c was 7%.  She does not consume caffeinated beverages. She states that she drinks mostly water.  Interval history After a 3 week trial of Myrbetriq 50 mg daily, she states that she has seen some mild benefit.  She has noted most of her improvement has been during the day.  She is still experiencing urgency and nocturia.  She states that nocturia is still persisting, she states that she may be going every hour now during the night which is an improvement from every 30 minutes.  The frequency and urgency during the day, she has noted she is able to hold it for several  more minutes that in the past.  PVR today is 0 mL. She has not experienced dysuria, suprapubic pain or gross hematuria. She is not experiencing fever, chills, nausea or vomiting.  Three weeks later, she stated the Myrbetriq is no longer effective.  She stated her urinary frequency and urgency had returned.  They are both back to baseline.  She is having daytime frequency of every 15-30 minutes and nighttime frequency of every 30 minutes.  She was not experiencing dysuria, gross hematuria or suprapubic pain. She was not experiencing fevers, chills, nausea or vomiting. She states that she only drinks water.  Her PVR is 16 mL.  She was then started on Vesicare which provided no relief or reduction in her symptoms.  She is having a great deal of bother with urgency and nocturia.  She is not experiencing dysuria, gross hematuria or suprapubic pain. She is not experiencing fevers, chills, nausea or vomiting.  Her PVR was 46 mL.  She was then given a trial of Toviaz 8 mg daily.  She states the Gala Murdoch is helping some what.  She is bothered her very great deal and nighttime urination, she is bothered a great deal with uncomfortable urge to urinate, the sudden urge to urinate with little or no warning and waking up at night due to the need to urinate.  She will be traveling to [place] soon to care for an ill family member and will be gone  for several weeks.  We'll continue the Toviaz for this time and we will reassess when she returns in January.  Her PVR was 48 mL.    Today, she is experiencing urgency x 0-3, frequency x 8, she engages in toilet mapping, incontinence x 0-3 and nocturia x 8.   Her PVR is 51 mL.  She finds the Toviaz 8 mg daily during the day, but she has not notice any improvement in the evening.  She is not complaining of gross hematuria, dysuria or suprapubic pain.  She is not having fevers, chills, nausea or vomiting.     PMH: Past Medical History:  Diagnosis Date  . Arthritis   . Depression   .  Diabetes (HCC)   . Hyperlipemia   . Hypertension   . Urinary frequency     Surgical History: Past Surgical History:  Procedure Laterality Date  . HERNIA REPAIR  2010    Home Medications:  Allergies as of 03/30/2016   No Known Allergies     Medication List       Accurate as of 03/30/16 11:02 AM. Always use your most recent med list.          enalapril 10 MG tablet Commonly known as:  VASOTEC Take 10 mg by mouth daily.   gemfibrozil 600 MG tablet Commonly known as:  LOPID Take 600 mg by mouth 2 (two) times daily before a meal.   meloxicam 15 MG tablet Commonly known as:  MOBIC Take 15 mg by mouth daily. Reported on 07/26/2015   metFORMIN 500 MG (MOD) 24 hr tablet Commonly known as:  GLUMETZA Take 500 mg by mouth daily with breakfast.   MYRBETRIQ 50 MG Tb24 tablet Generic drug:  mirabegron ER Take 50 mg by mouth daily.   rosuvastatin 5 MG tablet Commonly known as:  CRESTOR Take 5 mg by mouth daily.   TOVIAZ 8 MG Tb24 tablet Generic drug:  fesoterodine Take 8 mg by mouth daily.       Allergies: No Known Allergies  Family History: Family History  Problem Relation Age of Onset  . Kidney failure Father   . Kidney failure Mother   . Kidney failure Brother   . Kidney cancer Neg Hx   . Prostate cancer Neg Hx   . Bladder Cancer Neg Hx     Social History:  reports that she has never smoked. She has never used smokeless tobacco. She reports that she does not drink alcohol or use drugs.  ROS: UROLOGY Frequent Urination?: Yes Hard to postpone urination?: No Burning/pain with urination?: No Get up at night to urinate?: Yes Leakage of urine?: No Urine stream starts and stops?: No Trouble starting stream?: No Do you have to strain to urinate?: No Blood in urine?: No Urinary tract infection?: No Sexually transmitted disease?: No Injury to kidneys or bladder?: No Painful intercourse?: No Weak stream?: No Currently pregnant?: No Vaginal bleeding?:  No Last menstrual period?: n  Gastrointestinal Nausea?: No Vomiting?: No Indigestion/heartburn?: No Diarrhea?: No Constipation?: No  Constitutional Fever: No Night sweats?: No Weight loss?: No Fatigue?: No  Skin Skin rash/lesions?: No Itching?: No  Eyes Blurred vision?: No Double vision?: No  Ears/Nose/Throat Sore throat?: No Sinus problems?: No  Hematologic/Lymphatic Swollen glands?: No Easy bruising?: No  Cardiovascular Leg swelling?: No Chest pain?: No  Respiratory Cough?: No Shortness of breath?: No  Endocrine Excessive thirst?: No  Musculoskeletal Back pain?: No Joint pain?: Yes  Neurological Headaches?: No Dizziness?: No  Psychologic Depression?: No  Anxiety?: No  Physical Exam: BP 121/75   Pulse (!) 103   Ht 5\' 2"  (1.575 m)   Wt 187 lb 11.2 oz (85.1 kg)   BMI 34.33 kg/m   Constitutional: Well nourished. Alert and oriented, No acute distress. HEENT: [place] AT, moist mucus membranes. Trachea midline, no masses. Cardiovascular: No clubbing, cyanosis, or edema. Respiratory: Normal respiratory effort, no increased work of breathing. Skin: No rashes, bruises or suspicious lesions. Lymph: No cervical or inguinal adenopathy. Neurologic: Grossly intact, no focal deficits, moving all 4 extremities. Psychiatric: Normal mood and affect.   Pertinent Imaging: Results for Marlyne BeardsKHAN, Sydney (MRN [id number]) as of 03/30/2016 11:09  Ref. Range 03/30/2016 10:45  Scan Result Unknown 51    Assessment & Plan:    1. Urinary frequency  - failed beta-3 adrenergic receptor agonist and anticholinergic therapy  - offered behavioral therapies; bladder training, bladder control strategies, pelvic floor muscle training - deferred at this time  - fluid management- adequate fluid intake   - offered appointment with Dr. Sherron MondayMacDiarmid as [place] of [place] and will be able to undergo a more intensive work up if needed - appointment made  with Dr. Sherron MondayMacDiarmid   - she will continue Toviaz 8 mg until she sees Dr. Sherron MondayMacDiarmid  - BLADDER SCAN AMB NON-IMAGING  2. Nocturia:   See above.   Return for appointment with Dr. Sherron MondayMacDiarmid.  These notes generated with voice recognition software. I apologize for typographical errors.  Michiel CowboySHANNON Bary Limbach, PA-C  [facility] [address], [address] [place], [place] [address] [phone]

## 2016-03-30 ENCOUNTER — Encounter: Payer: Self-pay | Admitting: Urology

## 2016-03-30 ENCOUNTER — Ambulatory Visit: Payer: BLUE CROSS/BLUE SHIELD | Admitting: Urology

## 2016-03-30 VITALS — BP 121/75 | HR 103 | Ht 62.0 in | Wt 187.7 lb

## 2016-03-30 DIAGNOSIS — R350 Frequency of micturition: Secondary | ICD-10-CM | POA: Diagnosis not present

## 2016-03-30 DIAGNOSIS — R351 Nocturia: Secondary | ICD-10-CM | POA: Diagnosis not present

## 2016-03-30 LAB — BLADDER SCAN AMB NON-IMAGING: SCAN RESULT: 51

## 2016-04-28 ENCOUNTER — Encounter: Payer: Self-pay | Admitting: Urology

## 2016-04-28 ENCOUNTER — Ambulatory Visit (INDEPENDENT_AMBULATORY_CARE_PROVIDER_SITE_OTHER): Payer: BLUE CROSS/BLUE SHIELD | Admitting: Urology

## 2016-04-28 VITALS — BP 132/75 | HR 108 | Ht 62.0 in | Wt 189.0 lb

## 2016-04-28 DIAGNOSIS — R351 Nocturia: Secondary | ICD-10-CM

## 2016-04-28 MED ORDER — FESOTERODINE FUMARATE ER 8 MG PO TB24: 8.0000 mg | ORAL_TABLET | Freq: Every day | ORAL | 11 refills | Status: DC

## 2016-04-28 NOTE — Progress Notes (Signed)
04/28/2016 10:39 AM   Sydney Torres January 08, 1960 [id number]  Referring provider: [name], MD [address] [place], [place] [address]  No chief complaint on file.   HPI: The patient has a long history of nighttime and daytime frequency and his had a bladder hydrodistention. The beta 3 agonists helped minimally. She was prescribed Toviaz and behavioral therapy. She has a normal residual urine volume  Today The patient voids now every 1 or 2 hours on Toviaz due to urgency and not pain. She has fear of leakage. She can void every 30 minutes at night. She does not have ankle edema. She does not take a diuretic She has no history of cardiovascular disease, hypertension, significant renal or liver disease. She does not take steroids.  Clinically she is not infected.    PMH: Past Medical History:  Diagnosis Date  . Arthritis   . Depression   . Diabetes (HCC)   . Hyperlipemia   . Hypertension   . Urinary frequency     Surgical History: Past Surgical History:  Procedure Laterality Date  . HERNIA REPAIR  2010    Home Medications:  Allergies as of 04/28/2016   No Known Allergies     Medication List       Accurate as of 04/28/16 10:39 AM. Always use your most recent med list.          enalapril 10 MG tablet Commonly known as:  VASOTEC Take 10 mg by mouth daily.   gemfibrozil 600 MG tablet Commonly known as:  LOPID Take 600 mg by mouth 2 (two) times daily before a meal.   meloxicam 15 MG tablet Commonly known as:  MOBIC Take 15 mg by mouth daily. Reported on 07/26/2015   metFORMIN 500 MG (MOD) 24 hr tablet Commonly known as:  GLUMETZA Take 500 mg by mouth daily with breakfast.   MYRBETRIQ 50 MG Tb24 tablet Generic drug:  mirabegron ER Take 50 mg by mouth daily.   rosuvastatin 5 MG tablet Commonly known as:  CRESTOR Take 5 mg by mouth daily.   TOVIAZ 8 MG Tb24 tablet Generic drug:  fesoterodine Take 8 mg by mouth daily.       Allergies: No Known  Allergies  Family History: Family History  Problem Relation Age of Onset  . Kidney failure Father   . Kidney failure Mother   . Kidney failure Brother   . Kidney cancer Neg Hx   . Prostate cancer Neg Hx   . Bladder Cancer Neg Hx     Social History:  reports that she has never smoked. She has never used smokeless tobacco. She reports that she does not drink alcohol or use drugs.  ROS:                                        Physical Exam: There were no vitals taken for this visit.  Constitutional:  Alert and oriented, No acute distress.   Laboratory Data: No results found for: WBC, HGB, HCT, MCV, PLT   Urinalysis No results found for: COLORURINE, APPEARANCEUR, LABSPEC, PHURINE, GLUCOSEU, HGBUR, BILIRUBINUR, KETONESUR, PROTEINUR, UROBILINOGEN, NITRITE, LEUKOCYTESUR  Pertinent Imaging: none  Assessment & Plan:  The patient will return with a voiding diary in the next 2 or 3 weeks. I will assess her for a nocturnal diuresis and perhaps DDAVP. I did not order urodynamics at this stage. I think the  voiding diary will be very helpful. I looked into the medical record and she has not had a recent serum sodium or renal function panel.  There are no diagnoses linked to this encounter.  No Follow-up on file.  Martina Sinner, MD  [facility] [address] [place], [place] [address] [phone]

## 2016-06-05 ENCOUNTER — Ambulatory Visit: Payer: BLUE CROSS/BLUE SHIELD

## 2016-06-15 ENCOUNTER — Ambulatory Visit: Payer: BLUE CROSS/BLUE SHIELD

## 2017-10-24 IMAGING — MG MM DIGITAL DIAGNOSTIC UNILAT*R* W/ TOMO W/ CAD
6 series · 6 of 14 positions shown · non-contrast
Comparison: Previous exam(s).

CLINICAL DATA: Screening recall for a right breast asymmetry.

EXAM:
2D DIGITAL DIAGNOSTIC UNILATERAL RIGHT MAMMOGRAM WITH CAD AND
ADJUNCT TOMO

[R MLO synth-2D]
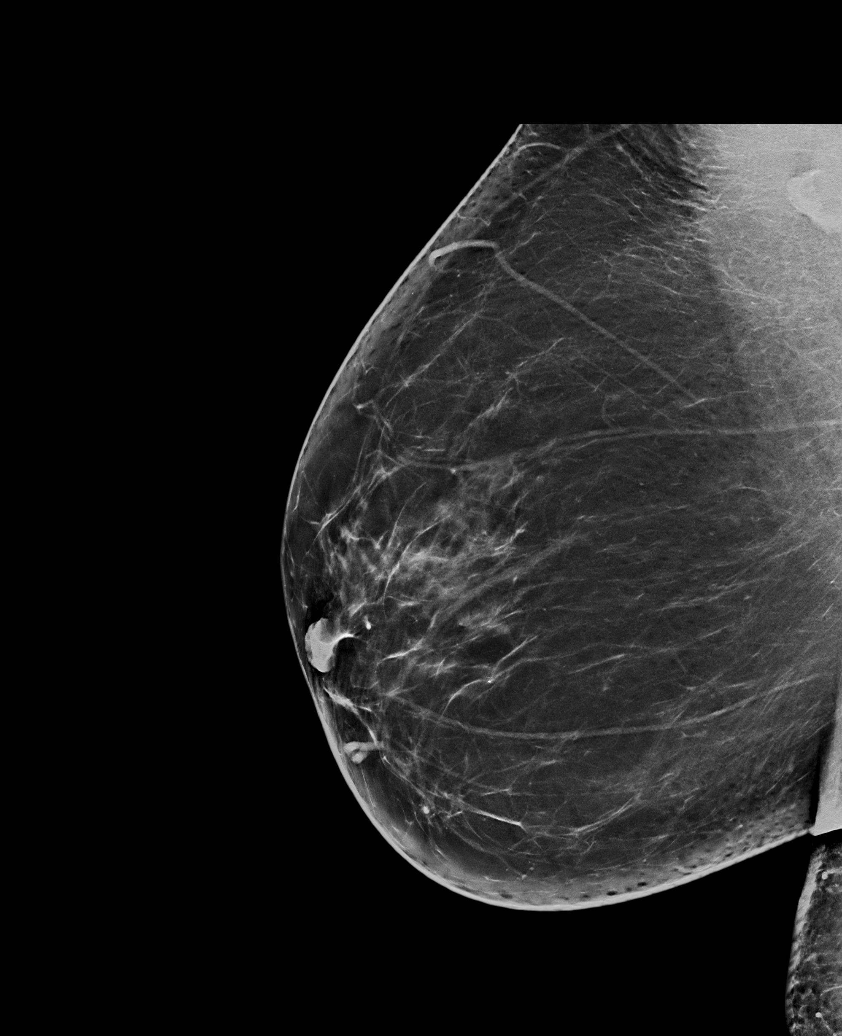

[R MLO]
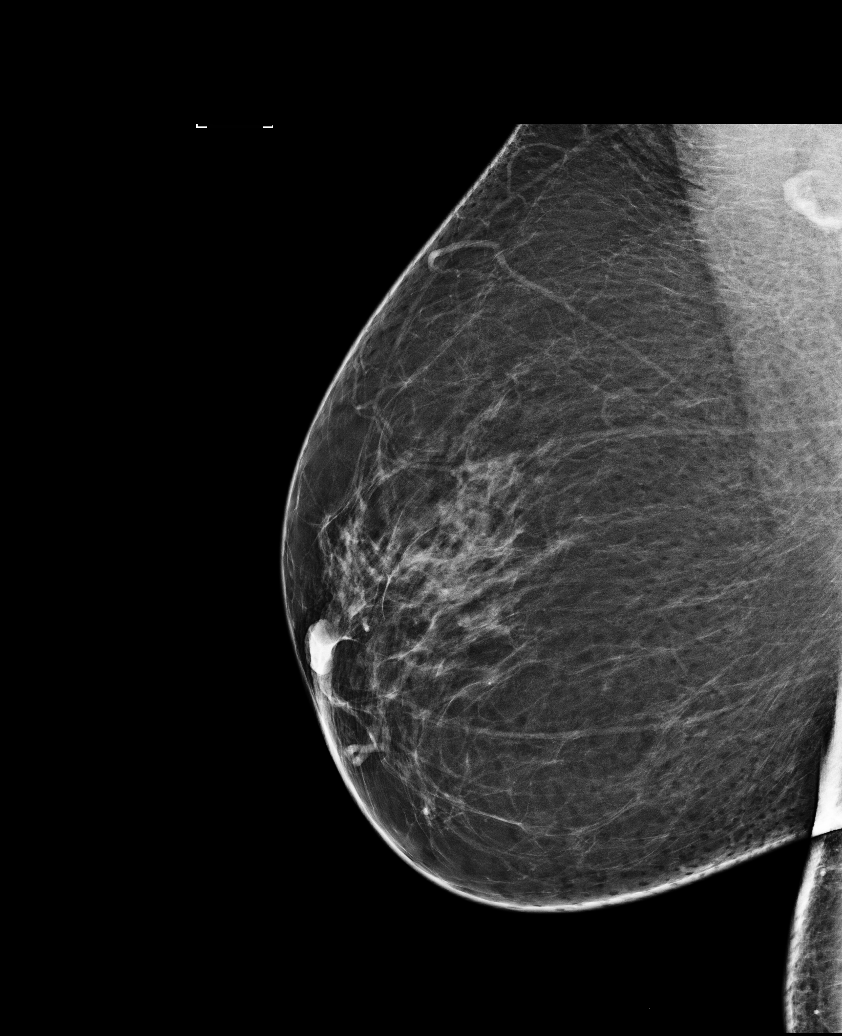

[R CC]
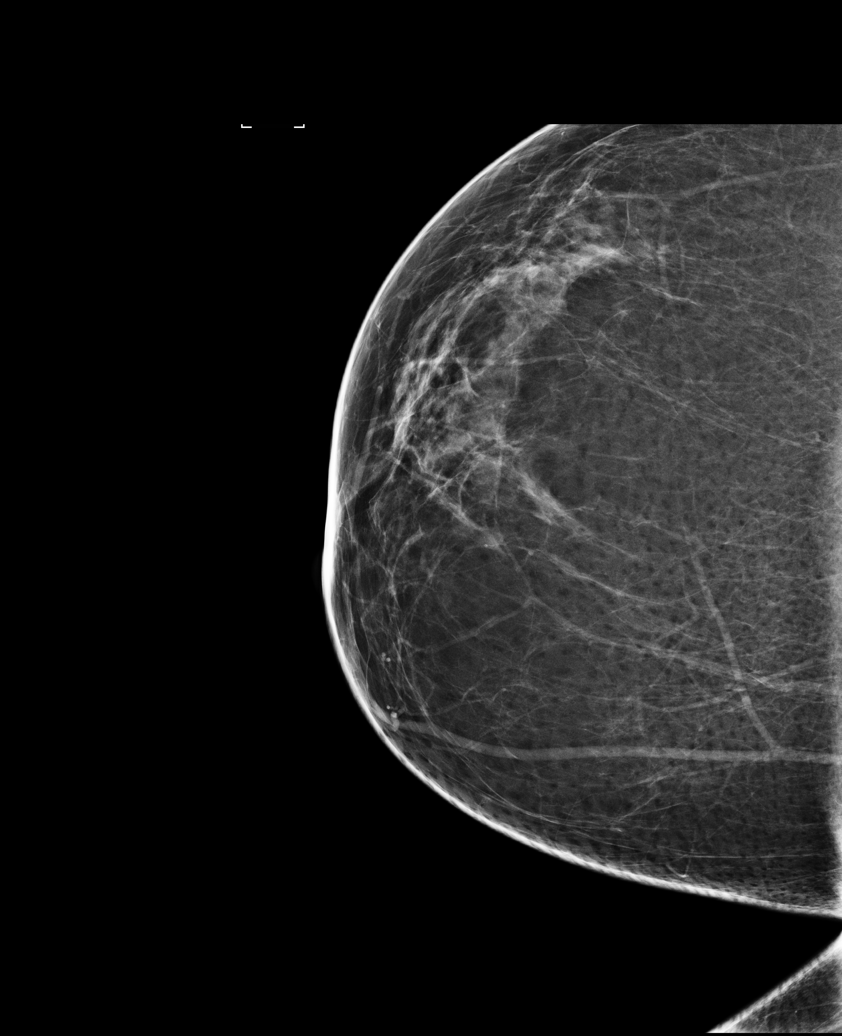

[R CC synth-2D]
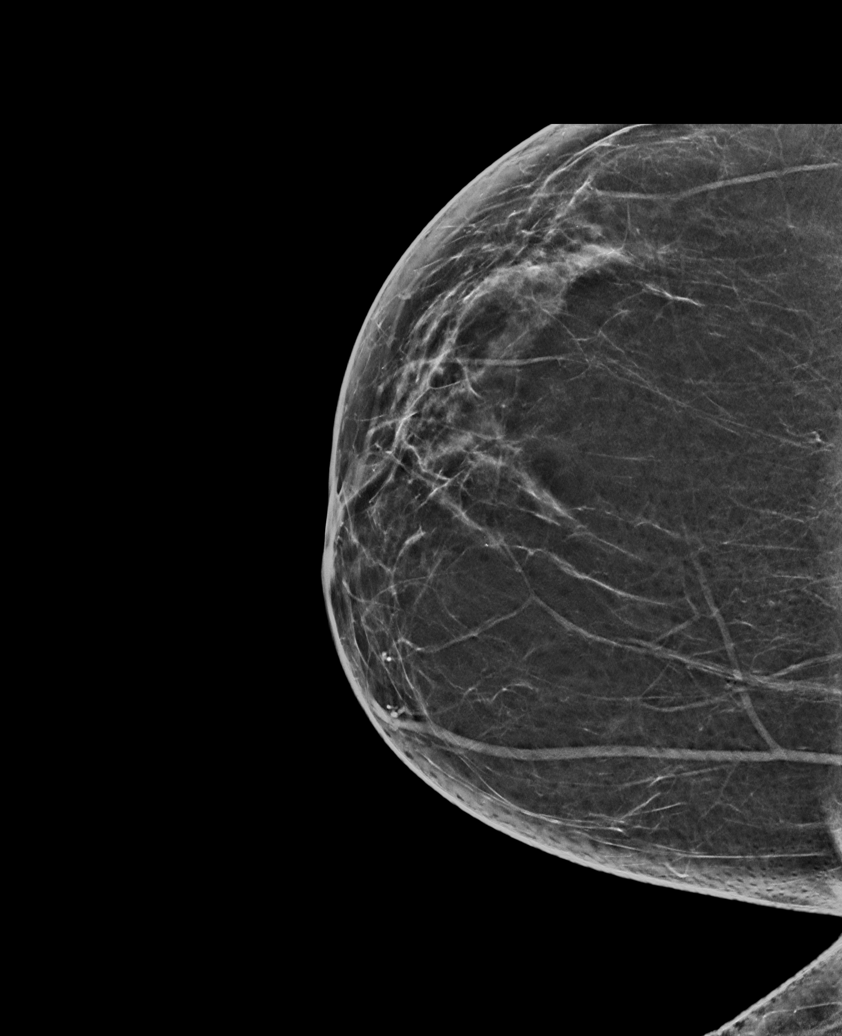

[R CC tomo · tomo slice 35/69.0]
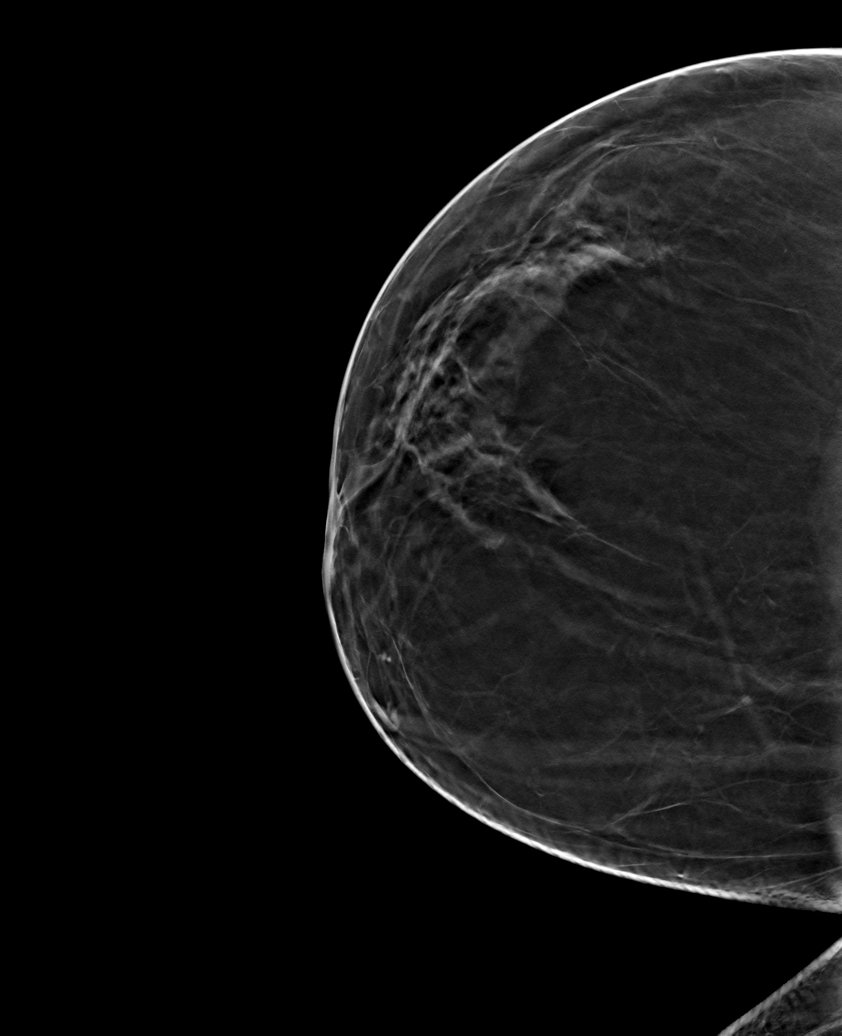

[R MLO tomo · tomo slice 43/85.0]
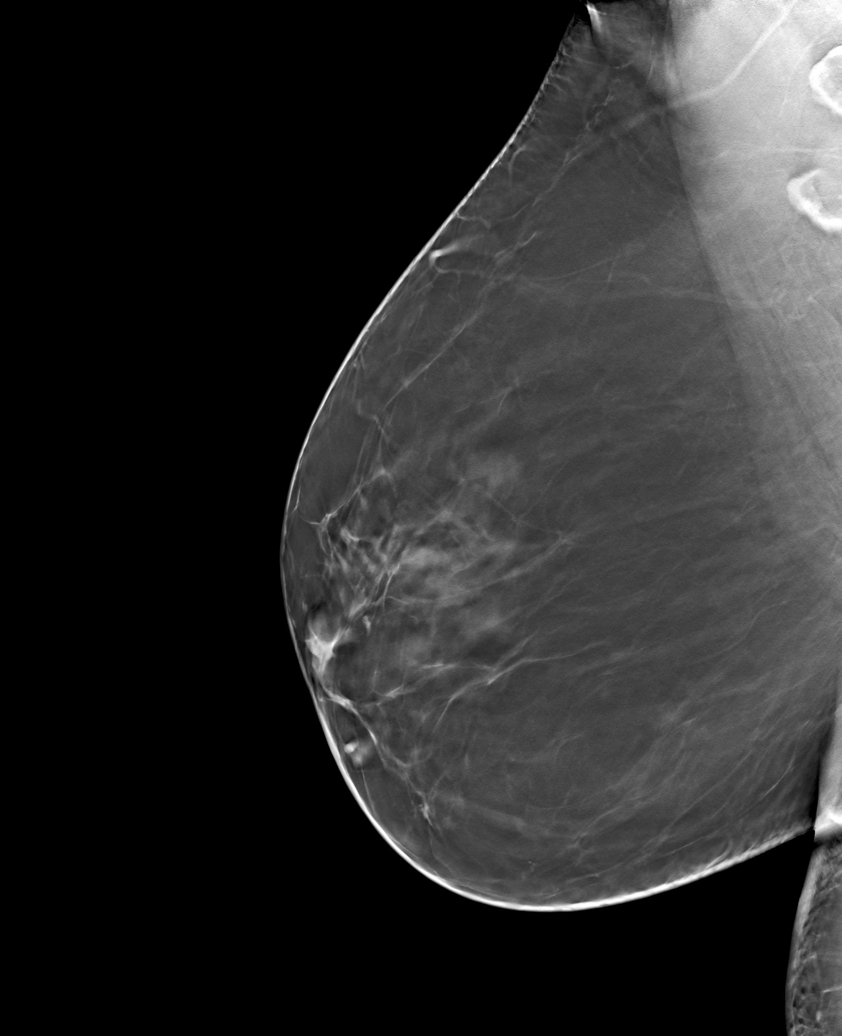

[6 of 14 positions shown; findings below may reference images not displayed]

ACR Breast Density Category b: There are scattered areas of
fibroglandular density.
FINDINGS: The asymmetry of concern in the right breast resolves with
additional tomosynthesis diagnostic imaging. No suspicious
calcifications, masses or areas of distortion are seen in the right
breast.

Mammographic images were processed with CAD.
IMPRESSION: Interval resolution of the asymmetry of concern in the right breast,
consistent with overlapping fibroglandular tissue.

RECOMMENDATION:
Screening mammogram in one year.(Code:[id number])

I have discussed the findings and recommendations with the patient.
Results were also provided in writing at the conclusion of the
visit. If applicable, a reminder letter will be sent to the patient
regarding the next appointment.

BI-RADS CATEGORY  1: Negative.

## 2019-08-24 ENCOUNTER — Other Ambulatory Visit: Payer: Self-pay | Admitting: Internal Medicine

## 2019-08-24 DIAGNOSIS — Z1231 Encounter for screening mammogram for malignant neoplasm of breast: Secondary | ICD-10-CM

## 2019-09-21 ENCOUNTER — Ambulatory Visit
Admission: RE | Admit: 2019-09-21 | Discharge: 2019-09-21 | Disposition: A | Discharge status: Home / Self Care | Payer: BLUE CROSS/BLUE SHIELD | Source: Ambulatory Visit | Attending: Internal Medicine | Admitting: Internal Medicine

## 2019-09-21 ENCOUNTER — Other Ambulatory Visit: Payer: Self-pay

## 2019-09-21 DIAGNOSIS — Z1231 Encounter for screening mammogram for malignant neoplasm of breast: Secondary | ICD-10-CM | POA: Diagnosis present

## 2020-08-26 ENCOUNTER — Other Ambulatory Visit: Payer: Self-pay | Admitting: Internal Medicine

## 2020-08-26 DIAGNOSIS — Z1231 Encounter for screening mammogram for malignant neoplasm of breast: Secondary | ICD-10-CM

## 2020-08-26 DIAGNOSIS — K760 Fatty (change of) liver, not elsewhere classified: Secondary | ICD-10-CM

## 2021-05-09 ENCOUNTER — Other Ambulatory Visit: Payer: Self-pay | Admitting: Internal Medicine

## 2021-09-26 DIAGNOSIS — E119 Type 2 diabetes mellitus without complications: Secondary | ICD-10-CM | POA: Diagnosis not present

## 2021-09-26 DIAGNOSIS — E782 Mixed hyperlipidemia: Secondary | ICD-10-CM | POA: Diagnosis not present

## 2021-09-26 DIAGNOSIS — S46912A Strain of unspecified muscle, fascia and tendon at shoulder and upper arm level, left arm, initial encounter: Secondary | ICD-10-CM | POA: Diagnosis not present

## 2021-11-28 DIAGNOSIS — E559 Vitamin D deficiency, unspecified: Secondary | ICD-10-CM | POA: Diagnosis not present

## 2021-11-28 DIAGNOSIS — E782 Mixed hyperlipidemia: Secondary | ICD-10-CM | POA: Diagnosis not present

## 2021-11-28 DIAGNOSIS — E119 Type 2 diabetes mellitus without complications: Secondary | ICD-10-CM | POA: Diagnosis not present

## 2021-11-28 DIAGNOSIS — I10 Essential (primary) hypertension: Secondary | ICD-10-CM | POA: Diagnosis not present

## 2021-12-02 DIAGNOSIS — E782 Mixed hyperlipidemia: Secondary | ICD-10-CM | POA: Diagnosis not present

## 2021-12-02 DIAGNOSIS — E668 Other obesity: Secondary | ICD-10-CM | POA: Diagnosis not present

## 2021-12-02 DIAGNOSIS — I10 Essential (primary) hypertension: Secondary | ICD-10-CM | POA: Diagnosis not present

## 2021-12-02 DIAGNOSIS — E119 Type 2 diabetes mellitus without complications: Secondary | ICD-10-CM | POA: Diagnosis not present

## 2022-01-01 DIAGNOSIS — E119 Type 2 diabetes mellitus without complications: Secondary | ICD-10-CM | POA: Diagnosis not present

## 2022-01-01 DIAGNOSIS — B372 Candidiasis of skin and nail: Secondary | ICD-10-CM | POA: Diagnosis not present

## 2022-01-01 DIAGNOSIS — E668 Other obesity: Secondary | ICD-10-CM | POA: Diagnosis not present

## 2022-01-01 DIAGNOSIS — E782 Mixed hyperlipidemia: Secondary | ICD-10-CM | POA: Diagnosis not present

## 2022-01-01 DIAGNOSIS — R030 Elevated blood-pressure reading, without diagnosis of hypertension: Secondary | ICD-10-CM | POA: Diagnosis not present

## 2022-01-22 DIAGNOSIS — I10 Essential (primary) hypertension: Secondary | ICD-10-CM | POA: Diagnosis not present

## 2022-01-22 DIAGNOSIS — E782 Mixed hyperlipidemia: Secondary | ICD-10-CM | POA: Diagnosis not present

## 2022-01-22 DIAGNOSIS — E1165 Type 2 diabetes mellitus with hyperglycemia: Secondary | ICD-10-CM | POA: Diagnosis not present

## 2022-03-23 ENCOUNTER — Ambulatory Visit: Payer: 59 | Admitting: Internal Medicine

## 2022-06-03 ENCOUNTER — Other Ambulatory Visit: Payer: 59

## 2022-06-03 ENCOUNTER — Other Ambulatory Visit: Payer: Self-pay | Admitting: Internal Medicine

## 2022-06-03 DIAGNOSIS — E1165 Type 2 diabetes mellitus with hyperglycemia: Secondary | ICD-10-CM | POA: Diagnosis not present

## 2022-06-03 DIAGNOSIS — I10 Essential (primary) hypertension: Secondary | ICD-10-CM | POA: Diagnosis not present

## 2022-06-03 DIAGNOSIS — E782 Mixed hyperlipidemia: Secondary | ICD-10-CM | POA: Diagnosis not present

## 2022-06-04 LAB — CMP14+EGFR
ALT: 16 IU/L (ref 0–32)
AST: 21 IU/L (ref 0–40)
Albumin/Globulin Ratio: 1.2 (ref 1.2–2.2)
Albumin: 4.3 g/dL (ref 3.9–4.9)
Alkaline Phosphatase: 101 IU/L (ref 44–121)
BUN/Creatinine Ratio: 19 (ref 12–28)
BUN: 13 mg/dL (ref 8–27)
Bilirubin Total: 0.5 mg/dL (ref 0.0–1.2)
CO2: 25 mmol/L (ref 20–29)
Calcium: 9.4 mg/dL (ref 8.7–10.3)
Chloride: 101 mmol/L (ref 96–106)
Creatinine, Ser: 0.68 mg/dL (ref 0.57–1.00)
Globulin, Total: 3.7 g/dL (ref 1.5–4.5)
Glucose: 116 mg/dL — ABNORMAL HIGH (ref 70–99)
Potassium: 4.4 mmol/L (ref 3.5–5.2)
Sodium: 141 mmol/L (ref 134–144)
Total Protein: 8 g/dL (ref 6.0–8.5)
eGFR: 97 mL/min/{1.73_m2} (ref >59)

## 2022-06-04 LAB — LIPID PANEL W/O CHOL/HDL RATIO
Cholesterol, Total: 235 mg/dL — ABNORMAL HIGH (ref 100–199)
HDL: 51 mg/dL (ref >39)
LDL Chol Calc (NIH): 146 mg/dL — ABNORMAL HIGH (ref 0–99)
Triglycerides: 212 mg/dL — ABNORMAL HIGH (ref 0–149)
VLDL Cholesterol Cal: 38 mg/dL (ref 5–40)

## 2022-06-04 LAB — HGB A1C W/O EAG: Hgb A1c MFr Bld: 6.8 % — ABNORMAL HIGH (ref 4.8–5.6)

## 2022-06-08 ENCOUNTER — Ambulatory Visit: Payer: 59 | Admitting: Internal Medicine

## 2022-06-08 ENCOUNTER — Ambulatory Visit (INDEPENDENT_AMBULATORY_CARE_PROVIDER_SITE_OTHER): Payer: 59 | Admitting: Internal Medicine

## 2022-06-08 ENCOUNTER — Encounter: Payer: Self-pay | Admitting: Internal Medicine

## 2022-06-08 VITALS — BP 128/78 | HR 81 | Ht 63.0 in | Wt 166.0 lb

## 2022-06-08 DIAGNOSIS — I10 Essential (primary) hypertension: Secondary | ICD-10-CM

## 2022-06-08 DIAGNOSIS — E782 Mixed hyperlipidemia: Secondary | ICD-10-CM | POA: Diagnosis not present

## 2022-06-08 DIAGNOSIS — E559 Vitamin D deficiency, unspecified: Secondary | ICD-10-CM | POA: Diagnosis not present

## 2022-06-08 DIAGNOSIS — E1165 Type 2 diabetes mellitus with hyperglycemia: Secondary | ICD-10-CM | POA: Diagnosis not present

## 2022-06-08 DIAGNOSIS — E669 Obesity, unspecified: Secondary | ICD-10-CM | POA: Diagnosis not present

## 2022-06-08 LAB — POCT CBG (FASTING - GLUCOSE)-MANUAL ENTRY: Glucose Fasting, POC: 91 mg/dL (ref 70–99)

## 2022-06-08 MED ORDER — METFORMIN HCL ER 500 MG PO TB24: 500.0000 mg | ORAL_TABLET | Freq: Every day | ORAL | 3 refills | Status: DC

## 2022-06-08 MED ORDER — VITAMIN D (ERGOCALCIFEROL) 1.25 MG (50000 UNIT) PO CAPS: 50000.0000 [IU] | ORAL_CAPSULE | ORAL | 3 refills | Status: DC

## 2022-06-08 MED ORDER — ROSUVASTATIN CALCIUM 20 MG PO TABS: 20.0000 mg | ORAL_TABLET | Freq: Every day | ORAL | 3 refills | Status: DC

## 2022-06-08 MED ORDER — ENALAPRIL MALEATE 10 MG PO TABS: 10.0000 mg | ORAL_TABLET | Freq: Every day | ORAL | 3 refills | Status: DC

## 2022-06-08 MED ORDER — PHENTERMINE HCL 37.5 MG PO TABS: 37.5000 mg | ORAL_TABLET | Freq: Every day | ORAL | 3 refills | Status: DC

## 2022-06-08 NOTE — Progress Notes (Signed)
Established Patient Office Visit  Subjective:  Patient ID: Sydney Torres, female    DOB: [birth year]  Age: 62 y.o. MRN: [id number]  Chief Complaint  Patient presents with   Follow-up    Follow up    Patient comes in for her follow-up today.  She had labs done which show her hemoglobin A1c to be at 6.8.  LDL is high as well as her triglycerides.  Her vitamin D levels are very low.  Patient has a strong family history of CAD. Patient was advised to resume her metformin as well as Crestor but she has not done yet.  She was also supposed to be taking enalapril but not taking it currently. She is only taking her Adipex-P which is helping her to lose  weight.  Patient advised that she has to resume her other medications as well. In general she is feeling well.  She has no other complaints.    No other concerns at this time.   Past Medical History:  Diagnosis Date   Arthritis    Depression    Diabetes (HCC)    Hyperlipemia    Hypertension    Urinary frequency     Past Surgical History:  Procedure Laterality Date   HERNIA REPAIR  2010    Social History   Socioeconomic History   Marital status: Married    Spouse name: Not on file   Number of children: Not on file   Years of education: Not on file   Highest education level: Not on file  Occupational History   Not on file  Tobacco Use   Smoking status: Never   Smokeless tobacco: Never  Substance and Sexual Activity   Alcohol use: No    Alcohol/week: 0.0 standard drinks of alcohol   Drug use: No   Sexual activity: Not on file  Other Topics Concern   Not on file  Social History Narrative   Not on file   Social Determinants of Health   Financial Resource Strain: Not on file  Food Insecurity: Not on file  Transportation Needs: Not on file  Physical Activity: Not on file  Stress: Not on file  Social Connections: Not on file  Intimate Partner Violence: Not on file    Family History  Problem Relation Age of Onset    Kidney failure Father    Kidney failure Mother    Kidney failure Brother    Kidney cancer Neg Hx    Prostate cancer Neg Hx    Bladder Cancer Neg Hx    Breast cancer Neg Hx     No Known Allergies  Review of Systems  Constitutional:  Negative for diaphoresis, fever, malaise/fatigue and weight loss.  HENT:  Negative for hearing loss, sinus pain and sore throat.   Eyes:  Negative for blurred vision, double vision, photophobia, pain, discharge and redness.  Respiratory:  Negative for cough, shortness of breath, wheezing and stridor.   Cardiovascular:  Negative for chest pain, palpitations, leg swelling and PND.  Gastrointestinal:  Negative for abdominal pain, blood in stool, diarrhea, heartburn, nausea and vomiting.  Genitourinary:  Negative for dysuria, frequency, hematuria and urgency.  Musculoskeletal:  Negative for falls, myalgias and neck pain.  Skin: Negative.   Neurological:  Negative for dizziness, tingling, tremors, sensory change, speech change, focal weakness, weakness and headaches.  Psychiatric/Behavioral:  Negative for depression. The patient is not nervous/anxious and does not have insomnia.        Objective:   BP 128/78  Pulse 81   Ht 5\' 3"  (1.6 m)   Wt 166 lb (75.3 kg)   SpO2 98%   BMI 29.41 kg/m   Vitals:   06/08/22 1355  BP: 128/78  Pulse: 81  Height: 5\' 3"  (1.6 m)  Weight: 166 lb (75.3 kg)  SpO2: 98%  BMI (Calculated): 29.41    Physical Exam Vitals and nursing note reviewed.  Constitutional:      Appearance: Normal appearance. She is obese.  HENT:     Head: Normocephalic and atraumatic.     Mouth/Throat:     Mouth: Mucous membranes are moist.     Pharynx: Oropharynx is clear. No oropharyngeal exudate or posterior oropharyngeal erythema.  Eyes:     Conjunctiva/sclera: Conjunctivae normal.  Cardiovascular:     Rate and Rhythm: Normal rate and regular rhythm.     Pulses: Normal pulses.     Heart sounds: Normal heart sounds. No murmur  heard. Pulmonary:     Effort: Pulmonary effort is normal.     Breath sounds: Normal breath sounds. No wheezing or rales.  Chest:     Chest wall: No tenderness.  Abdominal:     General: Bowel sounds are normal.     Palpations: Abdomen is soft.  Musculoskeletal:     Cervical back: Normal range of motion and neck supple.     Right lower leg: No edema.     Left lower leg: No edema.  Skin:    Findings: No erythema, lesion or rash.  Neurological:     General: No focal deficit present.     Mental Status: She is alert and oriented to person, place, and time.  Psychiatric:        Mood and Affect: Mood normal.        Behavior: Behavior normal.      Results for orders placed or performed in visit on 06/08/22  POCT CBG (Fasting - Glucose)  Result Value Ref Range   Glucose Fasting, POC 91 70 - 99 mg/dL    Recent Results (from the past 2160 hour(s))  CMP14+EGFR     Status: Abnormal   Collection Time: 06/03/22  8:55 AM  Result Value Ref Range   Glucose 116 (H) 70 - 99 mg/dL   BUN 13 8 - 27 mg/dL   Creatinine, Ser 1.61 0.57 - 1.00 mg/dL   eGFR 97 >09 UE/AVW/0.98   BUN/Creatinine Ratio 19 12 - 28   Sodium 141 134 - 144 mmol/L   Potassium 4.4 3.5 - 5.2 mmol/L   Chloride 101 96 - 106 mmol/L   CO2 25 20 - 29 mmol/L   Calcium 9.4 8.7 - 10.3 mg/dL   Total Protein 8.0 6.0 - 8.5 g/dL   Albumin 4.3 3.9 - 4.9 g/dL   Globulin, Total 3.7 1.5 - 4.5 g/dL   Albumin/Globulin Ratio 1.2 1.2 - 2.2   Bilirubin Total 0.5 0.0 - 1.2 mg/dL   Alkaline Phosphatase 101 44 - 121 IU/L   AST 21 0 - 40 IU/L   ALT 16 0 - 32 IU/L  Lipid Panel w/o Chol/HDL Ratio     Status: Abnormal   Collection Time: 06/03/22  8:55 AM  Result Value Ref Range   Cholesterol, Total 235 (H) 100 - 199 mg/dL   Triglycerides 119 (H) 0 - 149 mg/dL   HDL 51 >14 mg/dL   VLDL Cholesterol Cal 38 5 - 40 mg/dL   LDL Chol Calc (NIH) 782 (H) 0 - 99 mg/dL  Hgb N5A w/o  eAG     Status: Abnormal   Collection Time: 06/03/22  8:55 AM   Result Value Ref Range   Hgb A1c MFr Bld 6.8 (H) 4.8 - 5.6 %    Comment:          Prediabetes: 5.7 - 6.4          Diabetes: >6.4          Glycemic control for adults with diabetes: <7.0   POCT CBG (Fasting - Glucose)     Status: Normal   Collection Time: 06/08/22  2:02 PM  Result Value Ref Range   Glucose Fasting, POC 91 70 - 99 mg/dL      Assessment & Plan:  Patient will resume her medications now.  Strict diet control and exercise emphasized.  Mckayla was seen today for follow-up.  Diagnoses and all orders for this visit:  Type 2 diabetes mellitus with hyperglycemia, without long-term current use of insulin (HCC) -     POCT CBG (Fasting - Glucose) -     metFORMIN (GLUCOPHAGE-XR) 500 MG 24 hr tablet; Take 1 tablet (500 mg total) by mouth at bedtime.  Vitamin D deficiency -     Vitamin D, Ergocalciferol, (DRISDOL) 1.25 MG (50000 UNIT) CAPS capsule; Take 1 capsule (50,000 Units total) by mouth once a week.  Essential hypertension, benign -     enalapril (VASOTEC) 10 MG tablet; Take 1 tablet (10 mg total) by mouth daily.  Mixed hyperlipidemia -     rosuvastatin (CRESTOR) 20 MG tablet; Take 1 tablet (20 mg total) by mouth daily.  Obesity (BMI 30.0-34.9) -     phentermine (ADIPEX-P) 37.5 MG tablet; Take 1 tablet (37.5 mg total) by mouth daily before breakfast.   Follow up 56month  Total time spent: 30 minutes  [name], MD  06/08/2022   This document may have been prepared by [facility] Voice Recognition software and as such may include unintentional dictation errors.

## 2022-07-13 ENCOUNTER — Encounter: Payer: Self-pay | Admitting: Internal Medicine

## 2022-07-13 ENCOUNTER — Ambulatory Visit (INDEPENDENT_AMBULATORY_CARE_PROVIDER_SITE_OTHER): Payer: 59 | Admitting: Internal Medicine

## 2022-07-13 VITALS — BP 134/78 | HR 100 | Ht 64.0 in | Wt 170.8 lb

## 2022-07-13 DIAGNOSIS — E782 Mixed hyperlipidemia: Secondary | ICD-10-CM

## 2022-07-13 DIAGNOSIS — E1165 Type 2 diabetes mellitus with hyperglycemia: Secondary | ICD-10-CM | POA: Diagnosis not present

## 2022-07-13 DIAGNOSIS — E559 Vitamin D deficiency, unspecified: Secondary | ICD-10-CM | POA: Diagnosis not present

## 2022-07-13 DIAGNOSIS — I10 Essential (primary) hypertension: Secondary | ICD-10-CM | POA: Diagnosis not present

## 2022-07-13 LAB — POCT CBG (FASTING - GLUCOSE)-MANUAL ENTRY: Glucose Fasting, POC: 116 mg/dL — AB (ref 70–99)

## 2022-07-13 NOTE — Progress Notes (Signed)
Established Patient Office Visit  Subjective:  Patient ID: Sydney Torres, female    DOB: 1960/11/23  Age: 62 y.o. MRN: [id number]  Chief Complaint  Patient presents with   Follow-up    1 month follow up    Patient comes in for her 1 month follow-up.  She has been taking her medications regularly but her blood pressure is still on the high side.  Patient reports that she takes her metformin and enalapril at bedtime.  Advised to start taking them in the mornings.  She has not taken her weight loss medicine for a few days so has gained about 4 pounds. Patient admits to stress eating, will consider starting back  antianxiety medication.  Will discuss at next visit. Also complains of constipation.  Needs to drink more water and may take a probiotic every day.  Also can use Colace at night if goes 2 or 3 days without bowel movement.    No other concerns at this time.   Past Medical History:  Diagnosis Date   Arthritis    Depression    Diabetes (HCC)    Hyperlipemia    Hypertension    Urinary frequency     Past Surgical History:  Procedure Laterality Date   HERNIA REPAIR  2010    Social History   Socioeconomic History   Marital status: Married    Spouse name: Not on file   Number of children: Not on file   Years of education: Not on file   Highest education level: Not on file  Occupational History   Not on file  Tobacco Use   Smoking status: Never   Smokeless tobacco: Never  Substance and Sexual Activity   Alcohol use: No    Alcohol/week: 0.0 standard drinks of alcohol   Drug use: No   Sexual activity: Not on file  Other Topics Concern   Not on file  Social History Narrative   Not on file   Social Determinants of Health   Financial Resource Strain: Not on file  Food Insecurity: Not on file  Transportation Needs: Not on file  Physical Activity: Not on file  Stress: Not on file  Social Connections: Not on file  Intimate Partner Violence: Not on file    Family  History  Problem Relation Age of Onset   Kidney failure Father    Kidney failure Mother    Kidney failure Brother    Kidney cancer Neg Hx    Prostate cancer Neg Hx    Bladder Cancer Neg Hx    Breast cancer Neg Hx     No Known Allergies  Review of Systems  Constitutional: Negative.  Negative for malaise/fatigue.  HENT: Negative.  Negative for ear discharge, nosebleeds and sore throat.   Eyes: Negative.   Respiratory: Negative.  Negative for cough and shortness of breath.   Cardiovascular: Negative.  Negative for chest pain, palpitations and leg swelling.  Gastrointestinal: Negative.  Negative for abdominal pain, blood in stool, constipation, diarrhea, heartburn, melena, nausea and vomiting.  Genitourinary: Negative.  Negative for dysuria and flank pain.  Musculoskeletal: Negative.  Negative for joint pain and myalgias.  Skin: Negative.   Neurological: Negative.  Negative for dizziness and headaches.  Endo/Heme/Allergies: Negative.   Psychiatric/Behavioral: Negative.  Negative for depression and suicidal ideas. The patient is not nervous/anxious.        Objective:   BP 134/78   Pulse 100   Ht 5\' 4"  (1.626 m)   Wt 170 lb  12.8 oz (77.5 kg)   SpO2 94%   BMI 29.32 kg/m   Vitals:   07/13/22 1303  BP: 134/78  Pulse: 100  Height: 5\' 4"  (1.626 m)  Weight: 170 lb 12.8 oz (77.5 kg)  SpO2: 94%  BMI (Calculated): 29.3    Physical Exam Vitals and nursing note reviewed.  Constitutional:      Appearance: Normal appearance.  HENT:     Head: Normocephalic and atraumatic.     Nose: Nose normal.     Mouth/Throat:     Mouth: Mucous membranes are moist.     Pharynx: Oropharynx is clear.  Eyes:     Conjunctiva/sclera: Conjunctivae normal.     Pupils: Pupils are equal, round, and reactive to light.  Cardiovascular:     Rate and Rhythm: Normal rate and regular rhythm.     Pulses: Normal pulses.     Heart sounds: Normal heart sounds. No murmur heard. Pulmonary:     Effort:  Pulmonary effort is normal.     Breath sounds: Normal breath sounds. No wheezing.  Abdominal:     General: Bowel sounds are normal.     Palpations: Abdomen is soft.     Tenderness: There is no abdominal tenderness. There is no right CVA tenderness or left CVA tenderness.  Musculoskeletal:        General: Normal range of motion.     Cervical back: Normal range of motion.     Right lower leg: No edema.     Left lower leg: No edema.  Skin:    General: Skin is warm and dry.  Neurological:     General: No focal deficit present.     Mental Status: She is alert and oriented to person, place, and time.  Psychiatric:        Mood and Affect: Mood normal.        Behavior: Behavior normal.      Results for orders placed or performed in visit on 07/13/22  POCT CBG (Fasting - Glucose)  Result Value Ref Range   Glucose Fasting, POC 116 (A) 70 - 99 mg/dL        Assessment & Plan:  Patient advised to start taking her medications in the mornings.  Continue diet and exercise regimen.  Monitor blood pressure at home. Problem List Items Addressed This Visit     Type 2 diabetes mellitus with hyperglycemia, without long-term current use of insulin (HCC) - Primary   Relevant Orders   POCT CBG (Fasting - Glucose) (Completed)   Vitamin D deficiency   Mixed hyperlipidemia   Essential hypertension, benign    Return in about 2 months (around 09/13/2022).   Total time spent: 25 minutes  [name], MD  07/13/2022   This document may have been prepared by [facility] Voice Recognition software and as such may include unintentional dictation errors.

## 2022-08-31 ENCOUNTER — Ambulatory Visit: Payer: 59 | Admitting: Internal Medicine

## 2022-09-09 ENCOUNTER — Other Ambulatory Visit: Payer: Self-pay | Admitting: Internal Medicine

## 2022-09-09 ENCOUNTER — Other Ambulatory Visit: Payer: Self-pay

## 2022-09-09 ENCOUNTER — Other Ambulatory Visit: Payer: 59

## 2022-09-09 DIAGNOSIS — I10 Essential (primary) hypertension: Secondary | ICD-10-CM

## 2022-09-09 DIAGNOSIS — E782 Mixed hyperlipidemia: Secondary | ICD-10-CM

## 2022-09-09 DIAGNOSIS — E1165 Type 2 diabetes mellitus with hyperglycemia: Secondary | ICD-10-CM

## 2022-09-10 LAB — LIPID PANEL
Chol/HDL Ratio: 2.4 ratio (ref 0.0–4.4)
Cholesterol, Total: 127 mg/dL (ref 100–199)
HDL: 53 mg/dL (ref >39)
LDL Chol Calc (NIH): 53 mg/dL (ref 0–99)
Triglycerides: 119 mg/dL (ref 0–149)
VLDL Cholesterol Cal: 21 mg/dL (ref 5–40)

## 2022-09-10 LAB — CMP14+EGFR
ALT: 19 IU/L (ref 0–32)
AST: 22 IU/L (ref 0–40)
Albumin: 4.4 g/dL (ref 3.9–4.9)
Alkaline Phosphatase: 87 IU/L (ref 44–121)
BUN/Creatinine Ratio: 17 (ref 12–28)
BUN: 13 mg/dL (ref 8–27)
Bilirubin Total: 0.4 mg/dL (ref 0.0–1.2)
CO2: 24 mmol/L (ref 20–29)
Calcium: 9.2 mg/dL (ref 8.7–10.3)
Chloride: 101 mmol/L (ref 96–106)
Creatinine, Ser: 0.76 mg/dL (ref 0.57–1.00)
Globulin, Total: 3.2 g/dL (ref 1.5–4.5)
Glucose: 125 mg/dL — ABNORMAL HIGH (ref 70–99)
Potassium: 4.6 mmol/L (ref 3.5–5.2)
Sodium: 140 mmol/L (ref 134–144)
Total Protein: 7.6 g/dL (ref 6.0–8.5)
eGFR: 87 mL/min/{1.73_m2} (ref >59)

## 2022-09-10 LAB — HEMOGLOBIN A1C
Est. average glucose Bld gHb Est-mCnc: 151 mg/dL
Hgb A1c MFr Bld: 6.9 % — ABNORMAL HIGH (ref 4.8–5.6)

## 2022-09-15 ENCOUNTER — Ambulatory Visit (INDEPENDENT_AMBULATORY_CARE_PROVIDER_SITE_OTHER): Payer: 59 | Admitting: Internal Medicine

## 2022-09-15 ENCOUNTER — Ambulatory Visit
Admission: RE | Admit: 2022-09-15 | Discharge: 2022-09-15 | Disposition: A | Discharge status: Home / Self Care | Payer: 59 | Attending: Internal Medicine | Admitting: Internal Medicine

## 2022-09-15 ENCOUNTER — Encounter: Payer: Self-pay | Admitting: Internal Medicine

## 2022-09-15 ENCOUNTER — Ambulatory Visit
Admission: RE | Admit: 2022-09-15 | Discharge: 2022-09-15 | Disposition: A | Discharge status: Home / Self Care | Payer: 59 | Source: Ambulatory Visit | Attending: Internal Medicine | Admitting: Internal Medicine

## 2022-09-15 VITALS — BP 145/82 | HR 91 | Ht 64.0 in | Wt 175.0 lb

## 2022-09-15 DIAGNOSIS — F411 Generalized anxiety disorder: Secondary | ICD-10-CM | POA: Diagnosis not present

## 2022-09-15 DIAGNOSIS — M25562 Pain in left knee: Secondary | ICD-10-CM

## 2022-09-15 DIAGNOSIS — I10 Essential (primary) hypertension: Secondary | ICD-10-CM

## 2022-09-15 DIAGNOSIS — N3941 Urge incontinence: Secondary | ICD-10-CM | POA: Diagnosis not present

## 2022-09-15 DIAGNOSIS — M25462 Effusion, left knee: Secondary | ICD-10-CM | POA: Diagnosis not present

## 2022-09-15 DIAGNOSIS — E1165 Type 2 diabetes mellitus with hyperglycemia: Secondary | ICD-10-CM | POA: Diagnosis not present

## 2022-09-15 DIAGNOSIS — M799 Soft tissue disorder, unspecified: Secondary | ICD-10-CM | POA: Diagnosis not present

## 2022-09-15 DIAGNOSIS — M1712 Unilateral primary osteoarthritis, left knee: Secondary | ICD-10-CM | POA: Diagnosis not present

## 2022-09-15 LAB — POCT CBG (FASTING - GLUCOSE)-MANUAL ENTRY: Glucose Fasting, POC: 170 mg/dL — AB (ref 70–99)

## 2022-09-15 MED ORDER — VENLAFAXINE HCL ER 37.5 MG PO CP24: 37.5000 mg | ORAL_CAPSULE | Freq: Every day | ORAL | 2 refills | Status: DC

## 2022-09-15 MED ORDER — METFORMIN HCL ER 500 MG PO TB24: 500.0000 mg | ORAL_TABLET | Freq: Two times a day (BID) | ORAL | 3 refills | Status: DC

## 2022-09-15 MED ORDER — SOLIFENACIN SUCCINATE 10 MG PO TABS: 10.0000 mg | ORAL_TABLET | Freq: Every day | ORAL | 2 refills | Status: AC

## 2022-09-15 NOTE — Progress Notes (Signed)
Established Patient Office Visit  Subjective:  Patient ID: Sydney Torres, female    DOB: [birth year]  Age: 62 y.o. MRN: [id number]  Chief Complaint  Patient presents with   Follow-up    1 mo f/u    Patient comes in for follow-up and to discuss her lab results.  She has gained 5 pounds since her last visit.  She admits to not taking her Adipex-P regularly as it causes constipation. Her hemoglobin A1c is 6.9 now, and her blood pressure is also high-145/82. Patient reports of a history of fall on her left knee and feels intermittent pain over the patella.  Will check x-ray today. Will increase her metformin to 500 mg twice a day, and strict diet control. She reports of feeling tired and unmotivated, used to be on a SSRI, but had stopped herself.  She has tried Lexapro, Cymbalta and citalopram in the past, and they did not help. Will start with small dose of Effexor XR 37.5 mg/d. Patient reports urinary incontinence, also agrees to resume Vesicare.    No other concerns at this time.   Past Medical History:  Diagnosis Date   Arthritis    Depression    Diabetes (HCC)    Hyperlipemia    Hypertension    Urinary frequency     Past Surgical History:  Procedure Laterality Date   HERNIA REPAIR  2010    Social History   Socioeconomic History   Marital status: Married    Spouse name: Not on file   Number of children: Not on file   Years of education: Not on file   Highest education level: Not on file  Occupational History   Not on file  Tobacco Use   Smoking status: Never   Smokeless tobacco: Never  Substance and Sexual Activity   Alcohol use: No    Alcohol/week: 0.0 standard drinks of alcohol   Drug use: No   Sexual activity: Not on file  Other Topics Concern   Not on file  Social History Narrative   Not on file   Social Determinants of Health   Financial Resource Strain: Not on file  Food Insecurity: Not on file  Transportation Needs: Not on file  Physical  Activity: Not on file  Stress: Not on file  Social Connections: Not on file  Intimate Partner Violence: Not on file    Family History  Problem Relation Age of Onset   Kidney failure Father    Kidney failure Mother    Kidney failure Brother    Kidney cancer Neg Hx    Prostate cancer Neg Hx    Bladder Cancer Neg Hx    Breast cancer Neg Hx     No Known Allergies  Review of Systems  Constitutional:  Positive for malaise/fatigue. Negative for chills, diaphoresis and fever.  HENT: Negative.  Negative for congestion and sore throat.   Eyes: Negative.   Respiratory: Negative.  Negative for cough and shortness of breath.   Cardiovascular: Negative.  Negative for chest pain, palpitations and leg swelling.  Gastrointestinal: Negative.  Negative for abdominal pain, blood in stool, constipation, diarrhea, heartburn, melena, nausea and vomiting.  Genitourinary:  Positive for urgency. Negative for dysuria and flank pain.  Musculoskeletal: Negative.  Negative for joint pain and myalgias.  Skin: Negative.   Neurological: Negative.  Negative for dizziness and headaches.  Endo/Heme/Allergies: Negative.   Psychiatric/Behavioral:  Negative for depression and suicidal ideas. The patient is nervous/anxious.  Objective:   BP (!) 145/82   Pulse 91   Ht 5\' 4"  (1.626 m)   Wt 175 lb (79.4 kg)   SpO2 98%   BMI 30.04 kg/m   Vitals:   09/15/22 1257  BP: (!) 145/82  Pulse: 91  Height: 5\' 4"  (1.626 m)  Weight: 175 lb (79.4 kg)  SpO2: 98%  BMI (Calculated): 30.02    Physical Exam Vitals and nursing note reviewed.  Constitutional:      Appearance: Normal appearance.  HENT:     Head: Normocephalic and atraumatic.     Nose: Nose normal.     Mouth/Throat:     Mouth: Mucous membranes are moist.     Pharynx: Oropharynx is clear.  Eyes:     Conjunctiva/sclera: Conjunctivae normal.     Pupils: Pupils are equal, round, and reactive to light.  Cardiovascular:     Rate and Rhythm:  Normal rate and regular rhythm.     Pulses: Normal pulses.     Heart sounds: Normal heart sounds. No murmur heard. Pulmonary:     Effort: Pulmonary effort is normal.     Breath sounds: Normal breath sounds. No wheezing.  Abdominal:     General: Bowel sounds are normal.     Palpations: Abdomen is soft.     Tenderness: There is no abdominal tenderness. There is no right CVA tenderness or left CVA tenderness.  Musculoskeletal:        General: Normal range of motion.     Cervical back: Normal range of motion.     Right lower leg: No edema.     Left lower leg: No edema.  Skin:    General: Skin is warm and dry.  Neurological:     General: No focal deficit present.     Mental Status: She is alert and oriented to person, place, and time.  Psychiatric:        Mood and Affect: Mood normal.        Behavior: Behavior normal.      Results for orders placed or performed in visit on 09/15/22  POCT CBG (Fasting - Glucose)  Result Value Ref Range   Glucose Fasting, POC 170 (A) 70 - 99 mg/dL    Recent Results (from the past 2160 hour(s))  POCT CBG (Fasting - Glucose)     Status: Abnormal   Collection Time: 07/13/22  1:06 PM  Result Value Ref Range   Glucose Fasting, POC 116 (A) 70 - 99 mg/dL  Lipid panel     Status: None   Collection Time: 09/09/22 10:03 AM  Result Value Ref Range   Cholesterol, Total 127 100 - 199 mg/dL   Triglycerides 161 0 - 149 mg/dL   HDL 53 >09 mg/dL   VLDL Cholesterol Cal 21 5 - 40 mg/dL   LDL Chol Calc (NIH) 53 0 - 99 mg/dL   Chol/HDL Ratio 2.4 0.0 - 4.4 ratio    Comment:                                   T. Chol/HDL Ratio                                             Men  Women  1/2 Avg.Risk  3.4    3.3                                   Avg.Risk  5.0    4.4                                2X Avg.Risk  9.6    7.1                                3X Avg.Risk 23.4   11.0   Hemoglobin A1c     Status: Abnormal   Collection Time:  09/09/22 10:03 AM  Result Value Ref Range   Hgb A1c MFr Bld 6.9 (H) 4.8 - 5.6 %    Comment:          Prediabetes: 5.7 - 6.4          Diabetes: >6.4          Glycemic control for adults with diabetes: <7.0    Est. average glucose Bld gHb Est-mCnc 151 mg/dL  [id number]     Status: Abnormal   Collection Time: 09/09/22 10:04 AM  Result Value Ref Range   Glucose 125 (H) 70 - 99 mg/dL   BUN 13 8 - 27 mg/dL   Creatinine, Ser 4.78 0.57 - 1.00 mg/dL   eGFR 87 >29 FA/OZH/0.86   BUN/Creatinine Ratio 17 12 - 28   Sodium 140 134 - 144 mmol/L   Potassium 4.6 3.5 - 5.2 mmol/L   Chloride 101 96 - 106 mmol/L   CO2 24 20 - 29 mmol/L   Calcium 9.2 8.7 - 10.3 mg/dL   Total Protein 7.6 6.0 - 8.5 g/dL   Albumin 4.4 3.9 - 4.9 g/dL   Globulin, Total 3.2 1.5 - 4.5 g/dL   Bilirubin Total 0.4 0.0 - 1.2 mg/dL   Alkaline Phosphatase 87 44 - 121 IU/L   AST 22 0 - 40 IU/L   ALT 19 0 - 32 IU/L  POCT CBG (Fasting - Glucose)     Status: Abnormal   Collection Time: 09/15/22  1:05 PM  Result Value Ref Range   Glucose Fasting, POC 170 (A) 70 - 99 mg/dL      Assessment & Plan:  Increase metformin 500 mg to twice a day. Add Effexor and Vesicare. Monitor blood pressure at home. Check x-ray of her left knee, if negative she needs to resume physical activity plus a strict diet control. Patient advised to take her Adipex-P regularly and use fiber or stool softeners to help with constipation along with increased water intake. Problem List Items Addressed This Visit     Type 2 diabetes mellitus with hyperglycemia, without long-term current use of insulin (HCC)   Relevant Medications   metFORMIN (GLUCOPHAGE-XR) 500 MG 24 hr tablet   Other Relevant Orders   POCT CBG (Fasting - Glucose) (Completed)   Essential hypertension, benign - Primary   Urge incontinence of urine   Relevant Medications   solifenacin (VESICARE) 10 MG tablet   Other Visit Diagnoses     Acute pain of left knee       Relevant Orders   DG  Knee 1-2 Views Left   GAD (generalized anxiety disorder)       Relevant Medications   venlafaxine XR [facility]  XR) 37.5 MG 24 hr capsule       Return in about 3 weeks (around 10/06/2022).   Total time spent: 30 minutes  [name], MD  09/15/2022   This document may have been prepared by [facility] Voice Recognition software and as such may include unintentional dictation errors.

## 2022-10-06 ENCOUNTER — Ambulatory Visit: Payer: 59 | Admitting: Internal Medicine

## 2022-10-13 ENCOUNTER — Encounter: Payer: Self-pay | Admitting: Internal Medicine

## 2022-10-13 ENCOUNTER — Ambulatory Visit (INDEPENDENT_AMBULATORY_CARE_PROVIDER_SITE_OTHER): Payer: 59 | Admitting: Internal Medicine

## 2022-10-13 VITALS — BP 135/80 | HR 103 | Ht 64.0 in | Wt 171.8 lb

## 2022-10-13 DIAGNOSIS — E1165 Type 2 diabetes mellitus with hyperglycemia: Secondary | ICD-10-CM | POA: Diagnosis not present

## 2022-10-13 DIAGNOSIS — I10 Essential (primary) hypertension: Secondary | ICD-10-CM | POA: Diagnosis not present

## 2022-10-13 DIAGNOSIS — E66811 Obesity, class 1: Secondary | ICD-10-CM | POA: Insufficient documentation

## 2022-10-13 DIAGNOSIS — F411 Generalized anxiety disorder: Secondary | ICD-10-CM

## 2022-10-13 DIAGNOSIS — E782 Mixed hyperlipidemia: Secondary | ICD-10-CM

## 2022-10-13 LAB — POCT CBG (FASTING - GLUCOSE)-MANUAL ENTRY: Glucose Fasting, POC: 204 mg/dL — AB (ref 70–99)

## 2022-10-13 NOTE — Progress Notes (Signed)
Established Patient Office Visit  Subjective:  Patient ID: Sydney Torres, female    DOB: April 15, 1960  Age: 62 y.o. MRN: [id number]  Chief Complaint  Patient presents with   Follow-up    3 week f/u    Patient is here for her follow-up.  She is feeling better today.  Tolerating metformin 500 mg twice a day.  Also lost a few pounds on Adipex-P.  She did not start her Effexor or Vesicare but plans to start her Effexor at least. Still has some pain in her due to osteoarthritis.  Did receive steroid injections in the past but will wait for now. Advised to continue  and exercises at home as tolerated.    No other concerns at this time.   Past Medical History:  Diagnosis Date   Arthritis    Depression    Diabetes (HCC)    Hyperlipemia    Hypertension    Urinary frequency     Past Surgical History:  Procedure Laterality Date   HERNIA REPAIR  2010    Social History   Socioeconomic History   Marital status: Married    Spouse name: Not on file   Number of children: Not on file   Years of education: Not on file   Highest education level: Not on file  Occupational History   Not on file  Tobacco Use   Smoking status: Never   Smokeless tobacco: Never  Substance and Sexual Activity   Alcohol use: No    Alcohol/week: 0.0 standard drinks of alcohol   Drug use: No   Sexual activity: Not on file  Other Topics Concern   Not on file  Social History Narrative   Not on file   Social Determinants of Health   Financial Resource Strain: Not on file  Food Insecurity: Not on file  Transportation Needs: Not on file  Physical Activity: Not on file  Stress: Not on file  Social Connections: Not on file  Intimate Partner Violence: Not on file    Family History  Problem Relation Age of Onset   Kidney failure Father    Kidney failure Mother    Kidney failure Brother    Kidney cancer Neg Hx    Prostate cancer Neg Hx    Bladder Cancer Neg Hx    Breast cancer Neg Hx     No Known  Allergies  Review of Systems  Constitutional: Negative.  Negative for fever.  HENT: Negative.  Negative for hearing loss and sore throat.   Eyes: Negative.   Respiratory: Negative.  Negative for cough, shortness of breath and stridor.   Cardiovascular: Negative.  Negative for chest pain, palpitations and leg swelling.  Gastrointestinal: Negative.  Negative for abdominal pain, constipation, diarrhea, heartburn, nausea and vomiting.  Genitourinary: Negative.  Negative for dysuria and flank pain.  Musculoskeletal: Negative.  Negative for joint pain and myalgias.  Skin: Negative.   Neurological: Negative.  Negative for dizziness and headaches.  Endo/Heme/Allergies: Negative.   Psychiatric/Behavioral: Negative.  Negative for depression and suicidal ideas. The patient is not nervous/anxious.        Objective:   BP 135/80   Pulse (!) 103   Ht 5\' 4"  (1.626 m)   Wt 171 lb 12.8 oz (77.9 kg)   SpO2 94%   BMI 29.49 kg/m   Vitals:   10/13/22 1333  BP: 135/80  Pulse: (!) 103  Height: 5\' 4"  (1.626 m)  Weight: 171 lb 12.8 oz (77.9 kg)  SpO2: 94%  BMI (Calculated): 29.47    Physical Exam Vitals and nursing note reviewed.  Constitutional:      Appearance: Normal appearance.  HENT:     Head: Normocephalic and atraumatic.     Nose: Nose normal.     Mouth/Throat:     Mouth: Mucous membranes are moist.     Pharynx: Oropharynx is clear.  Eyes:     Conjunctiva/sclera: Conjunctivae normal.     Pupils: Pupils are equal, round, and reactive to light.  Cardiovascular:     Rate and Rhythm: Normal rate and regular rhythm.     Pulses: Normal pulses.     Heart sounds: Normal heart sounds. No murmur heard. Pulmonary:     Effort: Pulmonary effort is normal.     Breath sounds: Normal breath sounds. No wheezing.  Abdominal:     General: Bowel sounds are normal.     Palpations: Abdomen is soft.     Tenderness: There is no abdominal tenderness. There is no right CVA tenderness or left CVA  tenderness.  Musculoskeletal:        General: Normal range of motion.     Cervical back: Normal range of motion.     Right lower leg: No edema.     Left lower leg: No edema.  Skin:    General: Skin is warm and dry.  Neurological:     General: No focal deficit present.     Mental Status: She is alert and oriented to person, place, and time.  Psychiatric:        Mood and Affect: Mood normal.        Behavior: Behavior normal.      Results for orders placed or performed in visit on 10/13/22  POCT CBG (Fasting - Glucose)  Result Value Ref Range   Glucose Fasting, POC 204 (A) 70 - 99 mg/dL    Recent Results (from the past 2160 hour(s))  Lipid panel     Status: None   Collection Time: 09/09/22 10:03 AM  Result Value Ref Range   Cholesterol, Total 127 100 - 199 mg/dL   Triglycerides 161 0 - 149 mg/dL   HDL 53 >09 mg/dL   VLDL Cholesterol Cal 21 5 - 40 mg/dL   LDL Chol Calc (NIH) 53 0 - 99 mg/dL   Chol/HDL Ratio 2.4 0.0 - 4.4 ratio    Comment:                                   T. Chol/HDL Ratio                                             Men  Women                               1/2 Avg.Risk  3.4    3.3                                   Avg.Risk  5.0    4.4  2X Avg.Risk  9.6    7.1                                3X Avg.Risk 23.4   11.0   Hemoglobin A1c     Status: Abnormal   Collection Time: 09/09/22 10:03 AM  Result Value Ref Range   Hgb A1c MFr Bld 6.9 (H) 4.8 - 5.6 %    Comment:          Prediabetes: 5.7 - 6.4          Diabetes: >6.4          Glycemic control for adults with diabetes: <7.0    Est. average glucose Bld gHb Est-mCnc 151 mg/dL  [id number]     Status: Abnormal   Collection Time: 09/09/22 10:04 AM  Result Value Ref Range   Glucose 125 (H) 70 - 99 mg/dL   BUN 13 8 - 27 mg/dL   Creatinine, Ser 5.95 0.57 - 1.00 mg/dL   eGFR 87 >63 OV/FIE/3.32   BUN/Creatinine Ratio 17 12 - 28   Sodium 140 134 - 144 mmol/L   Potassium 4.6 3.5 -  5.2 mmol/L   Chloride 101 96 - 106 mmol/L   CO2 24 20 - 29 mmol/L   Calcium 9.2 8.7 - 10.3 mg/dL   Total Protein 7.6 6.0 - 8.5 g/dL   Albumin 4.4 3.9 - 4.9 g/dL   Globulin, Total 3.2 1.5 - 4.5 g/dL   Bilirubin Total 0.4 0.0 - 1.2 mg/dL   Alkaline Phosphatase 87 44 - 121 IU/L   AST 22 0 - 40 IU/L   ALT 19 0 - 32 IU/L  POCT CBG (Fasting - Glucose)     Status: Abnormal   Collection Time: 09/15/22  1:05 PM  Result Value Ref Range   Glucose Fasting, POC 170 (A) 70 - 99 mg/dL  POCT CBG (Fasting - Glucose)     Status: Abnormal   Collection Time: 10/13/22  1:40 PM  Result Value Ref Range   Glucose Fasting, POC 204 (A) 70 - 99 mg/dL      Assessment & Plan:  To continue current regimen. Diet and exercise emphasized. Problem List Items Addressed This Visit     Type 2 diabetes mellitus with hyperglycemia, without long-term current use of insulin (HCC) - Primary   Relevant Orders   POCT CBG (Fasting - Glucose) (Completed)   Mixed hyperlipidemia   Essential hypertension, benign   Obesity (BMI 30.0-34.9)   Other Visit Diagnoses     GAD (generalized anxiety disorder)           Return in about 2 months (around 12/13/2022).   Total time spent: 30 minutes  [name], MD  10/13/2022   This document may have been prepared by [facility] Voice Recognition software and as such may include unintentional dictation errors.

## 2022-12-10 ENCOUNTER — Other Ambulatory Visit: Payer: Self-pay

## 2022-12-10 ENCOUNTER — Other Ambulatory Visit: Payer: 59 | Admitting: Internal Medicine

## 2022-12-10 DIAGNOSIS — E1165 Type 2 diabetes mellitus with hyperglycemia: Secondary | ICD-10-CM

## 2022-12-10 DIAGNOSIS — I10 Essential (primary) hypertension: Secondary | ICD-10-CM

## 2022-12-10 DIAGNOSIS — E782 Mixed hyperlipidemia: Secondary | ICD-10-CM | POA: Diagnosis not present

## 2022-12-10 NOTE — Progress Notes (Signed)
Patient only came for labs.

## 2022-12-11 LAB — CMP14+EGFR
ALT: 15 [IU]/L (ref 0–32)
AST: 19 [IU]/L (ref 0–40)
Albumin: 4.3 g/dL (ref 3.9–4.9)
Alkaline Phosphatase: 79 [IU]/L (ref 44–121)
BUN/Creatinine Ratio: 27 (ref 12–28)
BUN: 21 mg/dL (ref 8–27)
Bilirubin Total: 0.5 mg/dL (ref 0.0–1.2)
CO2: 24 mmol/L (ref 20–29)
Calcium: 9.3 mg/dL (ref 8.7–10.3)
Chloride: 104 mmol/L (ref 96–106)
Creatinine, Ser: 0.78 mg/dL (ref 0.57–1.00)
Globulin, Total: 3.3 g/dL (ref 1.5–4.5)
Glucose: 113 mg/dL — ABNORMAL HIGH (ref 70–99)
Potassium: 4.6 mmol/L (ref 3.5–5.2)
Sodium: 143 mmol/L (ref 134–144)
Total Protein: 7.6 g/dL (ref 6.0–8.5)
eGFR: 85 mL/min/{1.73_m2} (ref >59)

## 2022-12-11 LAB — HEMOGLOBIN A1C
Est. average glucose Bld gHb Est-mCnc: 157 mg/dL
Hgb A1c MFr Bld: 7.1 % — ABNORMAL HIGH (ref 4.8–5.6)

## 2022-12-11 LAB — LIPID PANEL
Chol/HDL Ratio: 2.9 {ratio} (ref 0.0–4.4)
Cholesterol, Total: 132 mg/dL (ref 100–199)
HDL: 46 mg/dL (ref >39)
LDL Chol Calc (NIH): 64 mg/dL (ref 0–99)
Triglycerides: 122 mg/dL (ref 0–149)
VLDL Cholesterol Cal: 22 mg/dL (ref 5–40)

## 2022-12-14 ENCOUNTER — Encounter: Payer: Self-pay | Admitting: Internal Medicine

## 2022-12-14 ENCOUNTER — Ambulatory Visit (INDEPENDENT_AMBULATORY_CARE_PROVIDER_SITE_OTHER): Payer: 59 | Admitting: Internal Medicine

## 2022-12-14 VITALS — BP 130/70 | HR 93 | Ht 64.0 in | Wt 172.6 lb

## 2022-12-14 DIAGNOSIS — E1159 Type 2 diabetes mellitus with other circulatory complications: Secondary | ICD-10-CM

## 2022-12-14 DIAGNOSIS — E782 Mixed hyperlipidemia: Secondary | ICD-10-CM | POA: Diagnosis not present

## 2022-12-14 DIAGNOSIS — R300 Dysuria: Secondary | ICD-10-CM

## 2022-12-14 DIAGNOSIS — E1169 Type 2 diabetes mellitus with other specified complication: Secondary | ICD-10-CM | POA: Diagnosis not present

## 2022-12-14 DIAGNOSIS — E1165 Type 2 diabetes mellitus with hyperglycemia: Secondary | ICD-10-CM | POA: Diagnosis not present

## 2022-12-14 DIAGNOSIS — F411 Generalized anxiety disorder: Secondary | ICD-10-CM | POA: Diagnosis not present

## 2022-12-14 DIAGNOSIS — M5442 Lumbago with sciatica, left side: Secondary | ICD-10-CM | POA: Diagnosis not present

## 2022-12-14 DIAGNOSIS — I152 Hypertension secondary to endocrine disorders: Secondary | ICD-10-CM | POA: Diagnosis not present

## 2022-12-14 LAB — POC CREATINE & ALBUMIN,URINE
Creatinine, POC: 300 mg/dL
Microalbumin Ur, POC: 80 mg/L

## 2022-12-14 LAB — POCT URINALYSIS DIPSTICK
Bilirubin, UA: NEGATIVE
Blood, UA: NEGATIVE
Glucose, UA: NEGATIVE
Ketones, UA: NEGATIVE
Nitrite, UA: NEGATIVE
Protein, UA: NEGATIVE
Spec Grav, UA: 1.025 (ref 1.010–1.025)
Urobilinogen, UA: 1 U/dL
pH, UA: 5.5 (ref 5.0–8.0)

## 2022-12-14 LAB — GLUCOSE, POCT (MANUAL RESULT ENTRY): POC Glucose: 103 mg/dL — AB (ref 70–99)

## 2022-12-14 MED ORDER — BACLOFEN 10 MG PO TABS: 10.0000 mg | ORAL_TABLET | Freq: Every day | ORAL | 0 refills | Status: DC

## 2022-12-14 MED ORDER — METHYLPREDNISOLONE 4 MG PO TBPK: ORAL_TABLET | ORAL | 0 refills | Status: DC

## 2022-12-15 ENCOUNTER — Encounter: Payer: Self-pay | Admitting: Internal Medicine

## 2022-12-15 DIAGNOSIS — E1159 Type 2 diabetes mellitus with other circulatory complications: Secondary | ICD-10-CM | POA: Insufficient documentation

## 2022-12-15 DIAGNOSIS — F411 Generalized anxiety disorder: Secondary | ICD-10-CM | POA: Insufficient documentation

## 2022-12-15 DIAGNOSIS — E1169 Type 2 diabetes mellitus with other specified complication: Secondary | ICD-10-CM | POA: Insufficient documentation

## 2022-12-15 NOTE — Progress Notes (Signed)
Established Patient Office Visit  Subjective:  Patient ID: Sydney Torres, female    DOB: 10-13-1960  Age: 62 y.o. MRN: [id number]  Chief Complaint  Patient presents with   Follow-up    2 month    Patient comes in for follow-up and to discuss her lab results.  She had just returned from a trip, and her hemoglobin A1c is up slightly from before.  Patient admits to some dietary indiscretion, but will start a strict diet regimen from now on. Also reports of new onset lower back pain which is radiating around to the to the back of her left leg.  She does not recall trauma or fall.  No recent lifting, pushing or pulling.  Denies dysuria or burning micturition, will send her urine to the lab.   She has already tried over-the-counter nonsteroidals. Agrees to try Medrol Dosepak.  Will add baclofen.    No other concerns at this time.   Past Medical History:  Diagnosis Date   Arthritis    Depression    Diabetes (HCC)    Hyperlipemia    Hypertension    Urinary frequency     Past Surgical History:  Procedure Laterality Date   HERNIA REPAIR  2010    Social History   Socioeconomic History   Marital status: Married    Spouse name: Not on file   Number of children: Not on file   Years of education: Not on file   Highest education level: Not on file  Occupational History   Not on file  Tobacco Use   Smoking status: Never   Smokeless tobacco: Never  Substance and Sexual Activity   Alcohol use: No    Alcohol/week: 0.0 standard drinks of alcohol   Drug use: No   Sexual activity: Not on file  Other Topics Concern   Not on file  Social History Narrative   Not on file   Social Determinants of Health   Financial Resource Strain: Not on file  Food Insecurity: Not on file  Transportation Needs: Not on file  Physical Activity: Not on file  Stress: Not on file  Social Connections: Not on file  Intimate Partner Violence: Not on file    Family History  Problem Relation Age of  Onset   Kidney failure Father    Kidney failure Mother    Kidney failure Brother    Kidney cancer Neg Hx    Prostate cancer Neg Hx    Bladder Cancer Neg Hx    Breast cancer Neg Hx     No Known Allergies  Outpatient Medications Prior to Visit  Medication Sig   enalapril (VASOTEC) 10 MG tablet Take 1 tablet (10 mg total) by mouth daily.   metFORMIN (GLUCOPHAGE-XR) 500 MG 24 hr tablet Take 1 tablet (500 mg total) by mouth 2 (two) times daily with a meal.   phentermine (ADIPEX-P) 37.5 MG tablet Take 1 tablet (37.5 mg total) by mouth daily before breakfast.   rosuvastatin (CRESTOR) 20 MG tablet Take 1 tablet (20 mg total) by mouth daily.   solifenacin (VESICARE) 10 MG tablet Take 1 tablet (10 mg total) by mouth daily.   venlafaxine XR (EFFEXOR XR) 37.5 MG 24 hr capsule Take 1 capsule (37.5 mg total) by mouth daily.   Vitamin D, Ergocalciferol, (DRISDOL) 1.25 MG (50000 UNIT) CAPS capsule Take 1 capsule (50,000 Units total) by mouth once a week.   No facility-administered medications prior to visit.    Review of Systems  Constitutional:  Negative.  Negative for chills, diaphoresis, fever, malaise/fatigue and weight loss.  HENT: Negative.  Negative for ear discharge and sinus pain.   Eyes: Negative.   Respiratory: Negative.  Negative for cough, shortness of breath and stridor.   Cardiovascular: Negative.  Negative for chest pain, palpitations and leg swelling.  Gastrointestinal: Negative.  Negative for abdominal pain, constipation, diarrhea, heartburn, nausea and vomiting.  Genitourinary: Negative.  Negative for dysuria and flank pain.  Musculoskeletal:  Positive for back pain. Negative for joint pain and myalgias.  Skin: Negative.   Neurological: Negative.  Negative for dizziness, tingling, tremors, sensory change, speech change and headaches.  Endo/Heme/Allergies: Negative.   Psychiatric/Behavioral: Negative.  Negative for depression and suicidal ideas. The patient is not  nervous/anxious.        Objective:   BP 130/70   Pulse 93   Ht 5\' 4"  (1.626 m)   Wt 172 lb 9.6 oz (78.3 kg)   SpO2 96%   BMI 29.63 kg/m   Vitals:   12/14/22 1321  BP: 130/70  Pulse: 93  Height: 5\' 4"  (1.626 m)  Weight: 172 lb 9.6 oz (78.3 kg)  SpO2: 96%  BMI (Calculated): 29.61    Physical Exam   Results for orders placed or performed in visit on 12/14/22  POCT Glucose (CBG)  Result Value Ref Range   POC Glucose 103 (A) 70 - 99 mg/dl  POCT Urinalysis Dipstick (81002)  Result Value Ref Range   Color, UA yellow    Clarity, UA clear    Glucose, UA Negative Negative   Bilirubin, UA negative    Ketones, UA negative    Spec Grav, UA 1.025 1.010 - 1.025   Blood, UA negative    pH, UA 5.5 5.0 - 8.0   Protein, UA Negative Negative   Urobilinogen, UA 1.0 0.2 or 1.0 E.U./dL   Nitrite, UA negative    Leukocytes, UA Trace (A) Negative   Appearance     Odor    POC CREATINE & ALBUMIN,URINE  Result Value Ref Range   Microalbumin Ur, POC 80 mg/L   Creatinine, POC 300 mg/dL   Albumin/Creatinine Ratio, Urine, POC 30-300     Recent Results (from the past 2160 hour(s))  POCT CBG (Fasting - Glucose)     Status: Abnormal   Collection Time: 10/13/22  1:40 PM  Result Value Ref Range   Glucose Fasting, POC 204 (A) 70 - 99 mg/dL  [id number]     Status: Abnormal   Collection Time: 12/10/22 10:06 AM  Result Value Ref Range   Glucose 113 (H) 70 - 99 mg/dL   BUN 21 8 - 27 mg/dL   Creatinine, Ser 2.84 0.57 - 1.00 mg/dL   eGFR 85 >13 KG/MWN/0.27   BUN/Creatinine Ratio 27 12 - 28   Sodium 143 134 - 144 mmol/L   Potassium 4.6 3.5 - 5.2 mmol/L   Chloride 104 96 - 106 mmol/L   CO2 24 20 - 29 mmol/L   Calcium 9.3 8.7 - 10.3 mg/dL   Total Protein 7.6 6.0 - 8.5 g/dL   Albumin 4.3 3.9 - 4.9 g/dL   Globulin, Total 3.3 1.5 - 4.5 g/dL   Bilirubin Total 0.5 0.0 - 1.2 mg/dL   Alkaline Phosphatase 79 44 - 121 IU/L   AST 19 0 - 40 IU/L   ALT 15 0 - 32 IU/L  Hemoglobin A1c     Status:  Abnormal   Collection Time: 12/10/22 10:06 AM  Result Value Ref Range  Hgb A1c MFr Bld 7.1 (H) 4.8 - 5.6 %    Comment:          Prediabetes: 5.7 - 6.4          Diabetes: >6.4          Glycemic control for adults with diabetes: <7.0    Est. average glucose Bld gHb Est-mCnc 157 mg/dL  Lipid panel     Status: None   Collection Time: 12/10/22 10:06 AM  Result Value Ref Range   Cholesterol, Total 132 100 - 199 mg/dL   Triglycerides 409 0 - 149 mg/dL   HDL 46 >81 mg/dL   VLDL Cholesterol Cal 22 5 - 40 mg/dL   LDL Chol Calc (NIH) 64 0 - 99 mg/dL   Chol/HDL Ratio 2.9 0.0 - 4.4 ratio    Comment:                                   T. Chol/HDL Ratio                                             Men  Women                               1/2 Avg.Risk  3.4    3.3                                   Avg.Risk  5.0    4.4                                2X Avg.Risk  9.6    7.1                                3X Avg.Risk 23.4   11.0   POCT Glucose (CBG)     Status: Abnormal   Collection Time: 12/14/22  1:25 PM  Result Value Ref Range   POC Glucose 103 (A) 70 - 99 mg/dl  POCT Urinalysis Dipstick (19147)     Status: Abnormal   Collection Time: 12/14/22  3:03 PM  Result Value Ref Range   Color, UA yellow    Clarity, UA clear    Glucose, UA Negative Negative   Bilirubin, UA negative    Ketones, UA negative    Spec Grav, UA 1.025 1.010 - 1.025   Blood, UA negative    pH, UA 5.5 5.0 - 8.0   Protein, UA Negative Negative   Urobilinogen, UA 1.0 0.2 or 1.0 E.U./dL   Nitrite, UA negative    Leukocytes, UA Trace (A) Negative   Appearance     Odor    POC CREATINE & ALBUMIN,URINE     Status: Abnormal   Collection Time: 12/14/22  3:05 PM  Result Value Ref Range   Microalbumin Ur, POC 80 mg/L   Creatinine, POC 300 mg/dL   Albumin/Creatinine Ratio, Urine, POC 30-300       Assessment & Plan:  Continue with current medications. Trial of Medrol Dosepak and baclofen. Problem List Items Addressed This  Visit     Type 2 diabetes mellitus with hyperglycemia, without long-term current use of insulin (HCC)   Relevant Orders   POCT Glucose (CBG) (Completed)   POCT Urinalysis Dipstick (81002) (Completed)   POC CREATINE & ALBUMIN,URINE (Completed)   Hypertension associated with diabetes (HCC) - Primary   Combined hyperlipidemia associated with type 2 diabetes mellitus (HCC)   Other Visit Diagnoses     Low back pain with left-sided sciatica, unspecified back pain laterality, unspecified chronicity       Relevant Medications   methylPREDNISolone (MEDROL DOSEPAK) 4 MG TBPK tablet   baclofen (LIORESAL) 10 MG tablet   Dysuria       Relevant Orders   Urine Culture       Follow up 10 days.  Total time spent: 30 minutes  [name], MD  12/14/2022   This document may have been prepared by [facility] Voice Recognition software and as such may include unintentional dictation errors.

## 2022-12-16 LAB — URINE CULTURE

## 2022-12-28 ENCOUNTER — Encounter: Payer: Self-pay | Admitting: Internal Medicine

## 2022-12-28 ENCOUNTER — Ambulatory Visit (INDEPENDENT_AMBULATORY_CARE_PROVIDER_SITE_OTHER): Payer: 59 | Admitting: Internal Medicine

## 2022-12-28 VITALS — BP 120/80 | HR 108 | Ht 64.0 in | Wt 173.0 lb

## 2022-12-28 DIAGNOSIS — E1159 Type 2 diabetes mellitus with other circulatory complications: Secondary | ICD-10-CM

## 2022-12-28 DIAGNOSIS — M5442 Lumbago with sciatica, left side: Secondary | ICD-10-CM | POA: Diagnosis not present

## 2022-12-28 DIAGNOSIS — I152 Hypertension secondary to endocrine disorders: Secondary | ICD-10-CM

## 2022-12-28 DIAGNOSIS — E782 Mixed hyperlipidemia: Secondary | ICD-10-CM | POA: Diagnosis not present

## 2022-12-28 DIAGNOSIS — E1169 Type 2 diabetes mellitus with other specified complication: Secondary | ICD-10-CM

## 2022-12-28 DIAGNOSIS — E1165 Type 2 diabetes mellitus with hyperglycemia: Secondary | ICD-10-CM | POA: Diagnosis not present

## 2022-12-28 LAB — GLUCOSE, POCT (MANUAL RESULT ENTRY): POC Glucose: 105 mg/dL — AB (ref 70–99)

## 2022-12-28 NOTE — Progress Notes (Signed)
Established Patient Office Visit  Subjective:  Patient ID: Sydney Torres, female    DOB: [birth year]  Age: 62 y.o. MRN: [id number]  Chief Complaint  Patient presents with   Follow-up    2 week    Patient comes in for follow-up of her back pain.  She has completed prednisone Dosepak and is taking the muscle relaxer and her symptoms have improved remarkably.  Advised to avoid triggering factors like bending, pushing, lifting. Otherwise patient is feels well.  No new complaints.    No other concerns at this time.   Past Medical History:  Diagnosis Date   Arthritis    Depression    Diabetes (HCC)    Hyperlipemia    Hypertension    Urinary frequency     Past Surgical History:  Procedure Laterality Date   HERNIA REPAIR  2010    Social History   Socioeconomic History   Marital status: Married    Spouse name: Not on file   Number of children: Not on file   Years of education: Not on file   Highest education level: Not on file  Occupational History   Not on file  Tobacco Use   Smoking status: Never   Smokeless tobacco: Never  Substance and Sexual Activity   Alcohol use: No    Alcohol/week: 0.0 standard drinks of alcohol   Drug use: No   Sexual activity: Not on file  Other Topics Concern   Not on file  Social History Narrative   Not on file   Social Drivers of Health   Financial Resource Strain: Not on file  Food Insecurity: Not on file  Transportation Needs: Not on file  Physical Activity: Not on file  Stress: Not on file  Social Connections: Not on file  Intimate Partner Violence: Not on file    Family History  Problem Relation Age of Onset   Kidney failure Father    Kidney failure Mother    Kidney failure Brother    Kidney cancer Neg Hx    Prostate cancer Neg Hx    Bladder Cancer Neg Hx    Breast cancer Neg Hx     No Known Allergies  Outpatient Medications Prior to Visit  Medication Sig   baclofen (LIORESAL) 10 MG tablet Take 1 tablet (10 mg  total) by mouth at bedtime.   enalapril (VASOTEC) 10 MG tablet Take 1 tablet (10 mg total) by mouth daily.   metFORMIN (GLUCOPHAGE-XR) 500 MG 24 hr tablet Take 1 tablet (500 mg total) by mouth 2 (two) times daily with a meal.   phentermine (ADIPEX-P) 37.5 MG tablet Take 1 tablet (37.5 mg total) by mouth daily before breakfast.   rosuvastatin (CRESTOR) 20 MG tablet Take 1 tablet (20 mg total) by mouth daily.   solifenacin (VESICARE) 10 MG tablet Take 1 tablet (10 mg total) by mouth daily.   venlafaxine XR (EFFEXOR XR) 37.5 MG 24 hr capsule Take 1 capsule (37.5 mg total) by mouth daily.   Vitamin D, Ergocalciferol, (DRISDOL) 1.25 MG (50000 UNIT) CAPS capsule Take 1 capsule (50,000 Units total) by mouth once a week.   [DISCONTINUED] methylPREDNISolone (MEDROL DOSEPAK) 4 MG TBPK tablet Use as directed (Patient not taking: Reported on 12/28/2022)   No facility-administered medications prior to visit.    Review of Systems  Constitutional: Negative.  Negative for chills, fever and weight loss.  HENT: Negative.  Negative for congestion.   Eyes: Negative.   Respiratory: Negative.  Negative for cough and  shortness of breath.   Cardiovascular: Negative.  Negative for chest pain, palpitations and leg swelling.  Gastrointestinal: Negative.  Negative for abdominal pain, constipation, diarrhea, heartburn, nausea and vomiting.  Genitourinary: Negative.  Negative for dysuria and flank pain.  Musculoskeletal:  Negative for back pain, joint pain, myalgias and neck pain.  Skin: Negative.   Neurological: Negative.  Negative for dizziness, tingling, tremors, sensory change, speech change, focal weakness and headaches.  Endo/Heme/Allergies: Negative.   Psychiatric/Behavioral: Negative.  Negative for depression and suicidal ideas. The patient is not nervous/anxious.        Objective:   BP 120/80   Pulse (!) 108   Wt 173 lb (78.5 kg)   SpO2 93%   BMI 29.70 kg/m   Vitals:   12/28/22 1425  BP: 120/80   Pulse: (!) 108  Weight: 173 lb (78.5 kg)  SpO2: 93%  BMI (Calculated): 29.68    Physical Exam Vitals and nursing note reviewed.  Constitutional:      Appearance: Normal appearance.  HENT:     Head: Normocephalic and atraumatic.     Nose: Nose normal.     Mouth/Throat:     Mouth: Mucous membranes are moist.     Pharynx: Oropharynx is clear.  Eyes:     Conjunctiva/sclera: Conjunctivae normal.     Pupils: Pupils are equal, round, and reactive to light.  Cardiovascular:     Rate and Rhythm: Normal rate and regular rhythm.     Pulses: Normal pulses.     Heart sounds: Normal heart sounds. No murmur heard. Pulmonary:     Effort: Pulmonary effort is normal.     Breath sounds: Normal breath sounds. No wheezing.  Abdominal:     General: Bowel sounds are normal.     Palpations: Abdomen is soft.     Tenderness: There is no abdominal tenderness. There is no right CVA tenderness or left CVA tenderness.  Musculoskeletal:        General: Normal range of motion.     Cervical back: Normal range of motion.     Right lower leg: No edema.     Left lower leg: No edema.  Skin:    General: Skin is warm and dry.  Neurological:     General: No focal deficit present.     Mental Status: She is alert and oriented to person, place, and time.  Psychiatric:        Mood and Affect: Mood normal.        Behavior: Behavior normal.      Results for orders placed or performed in visit on 12/28/22  POCT Glucose (CBG)  Result Value Ref Range   POC Glucose 105 (A) 70 - 99 mg/dl    Recent Results (from the past 2160 hours)  POCT CBG (Fasting - Glucose)     Status: Abnormal   Collection Time: 10/13/22  1:40 PM  Result Value Ref Range   Glucose Fasting, POC 204 (A) 70 - 99 mg/dL  [id number]     Status: Abnormal   Collection Time: 12/10/22 10:06 AM  Result Value Ref Range   Glucose 113 (H) 70 - 99 mg/dL   BUN 21 8 - 27 mg/dL   Creatinine, Ser 4.09 0.57 - 1.00 mg/dL   eGFR 85 >81 XB/JYN/8.29    BUN/Creatinine Ratio 27 12 - 28   Sodium 143 134 - 144 mmol/L   Potassium 4.6 3.5 - 5.2 mmol/L   Chloride 104 96 - 106 mmol/L   CO2  24 20 - 29 mmol/L   Calcium 9.3 8.7 - 10.3 mg/dL   Total Protein 7.6 6.0 - 8.5 g/dL   Albumin 4.3 3.9 - 4.9 g/dL   Globulin, Total 3.3 1.5 - 4.5 g/dL   Bilirubin Total 0.5 0.0 - 1.2 mg/dL   Alkaline Phosphatase 79 44 - 121 IU/L   AST 19 0 - 40 IU/L   ALT 15 0 - 32 IU/L  Hemoglobin A1c     Status: Abnormal   Collection Time: 12/10/22 10:06 AM  Result Value Ref Range   Hgb A1c MFr Bld 7.1 (H) 4.8 - 5.6 %    Comment:          Prediabetes: 5.7 - 6.4          Diabetes: >6.4          Glycemic control for adults with diabetes: <7.0    Est. average glucose Bld gHb Est-mCnc 157 mg/dL  Lipid panel     Status: None   Collection Time: 12/10/22 10:06 AM  Result Value Ref Range   Cholesterol, Total 132 100 - 199 mg/dL   Triglycerides 161 0 - 149 mg/dL   HDL 46 >09 mg/dL   VLDL Cholesterol Cal 22 5 - 40 mg/dL   LDL Chol Calc (NIH) 64 0 - 99 mg/dL   Chol/HDL Ratio 2.9 0.0 - 4.4 ratio    Comment:                                   T. Chol/HDL Ratio                                             Men  Women                               1/2 Avg.Risk  3.4    3.3                                   Avg.Risk  5.0    4.4                                2X Avg.Risk  9.6    7.1                                3X Avg.Risk 23.4   11.0   POCT Glucose (CBG)     Status: Abnormal   Collection Time: 12/14/22  1:25 PM  Result Value Ref Range   POC Glucose 103 (A) 70 - 99 mg/dl  Urine Culture     Status: None   Collection Time: 12/14/22  2:55 PM   Specimen: Urine   UC  Result Value Ref Range   Urine Culture, Routine Final report    Organism ID, Bacteria Comment     Comment: Culture shows less than 10,000 colony forming units of bacteria per milliliter of urine. This colony count is not generally considered to be clinically significant.   POCT Urinalysis Dipstick (60454)      Status: Abnormal  Collection Time: 12/14/22  3:03 PM  Result Value Ref Range   Color, UA yellow    Clarity, UA clear    Glucose, UA Negative Negative   Bilirubin, UA negative    Ketones, UA negative    Spec Grav, UA 1.025 1.010 - 1.025   Blood, UA negative    pH, UA 5.5 5.0 - 8.0   Protein, UA Negative Negative   Urobilinogen, UA 1.0 0.2 or 1.0 E.U./dL   Nitrite, UA negative    Leukocytes, UA Trace (A) Negative   Appearance     Odor    POC CREATINE & ALBUMIN,URINE     Status: Abnormal   Collection Time: 12/14/22  3:05 PM  Result Value Ref Range   Microalbumin Ur, POC 80 mg/L   Creatinine, POC 300 mg/dL   Albumin/Creatinine Ratio, Urine, POC 30-300   POCT Glucose (CBG)     Status: Abnormal   Collection Time: 12/28/22  2:29 PM  Result Value Ref Range   POC Glucose 105 (A) 70 - 99 mg/dl      Assessment & Plan:  Patient advised to continue her current medications.  Strict diet control and weight reduction emphasized. Problem List Items Addressed This Visit     Type 2 diabetes mellitus with hyperglycemia, without long-term current use of insulin (HCC)   Relevant Orders   POCT Glucose (CBG) (Completed)   Hypertension associated with diabetes (HCC) - Primary   Combined hyperlipidemia associated with type 2 diabetes mellitus (HCC)   Other Visit Diagnoses       Low back pain with left-sided sciatica, unspecified back pain laterality, unspecified chronicity           Return in about 3 months (around 03/28/2023).   Total time spent: 25 minutes  [name], MD  12/28/2022   This document may have been prepared by [facility] Voice Recognition software and as such may include unintentional dictation errors.

## 2023-02-04 ENCOUNTER — Encounter: Payer: Self-pay | Admitting: Internal Medicine

## 2023-02-04 ENCOUNTER — Other Ambulatory Visit: Payer: Self-pay | Admitting: Internal Medicine

## 2023-02-04 ENCOUNTER — Ambulatory Visit (INDEPENDENT_AMBULATORY_CARE_PROVIDER_SITE_OTHER): Payer: No Typology Code available for payment source | Admitting: Internal Medicine

## 2023-02-04 VITALS — BP 118/68 | HR 102 | Ht 64.0 in | Wt 176.8 lb

## 2023-02-04 DIAGNOSIS — E1159 Type 2 diabetes mellitus with other circulatory complications: Secondary | ICD-10-CM

## 2023-02-04 DIAGNOSIS — F411 Generalized anxiety disorder: Secondary | ICD-10-CM

## 2023-02-04 DIAGNOSIS — E1165 Type 2 diabetes mellitus with hyperglycemia: Secondary | ICD-10-CM

## 2023-02-04 DIAGNOSIS — M5442 Lumbago with sciatica, left side: Secondary | ICD-10-CM | POA: Diagnosis not present

## 2023-02-04 DIAGNOSIS — E66811 Obesity, class 1: Secondary | ICD-10-CM

## 2023-02-04 DIAGNOSIS — M791 Myalgia, unspecified site: Secondary | ICD-10-CM | POA: Diagnosis not present

## 2023-02-04 DIAGNOSIS — I152 Hypertension secondary to endocrine disorders: Secondary | ICD-10-CM

## 2023-02-04 LAB — POCT CBG (FASTING - GLUCOSE)-MANUAL ENTRY: Glucose Fasting, POC: 140 mg/dL — AB (ref 70–99)

## 2023-02-04 MED ORDER — GABAPENTIN 100 MG PO CAPS: 100.0000 mg | ORAL_CAPSULE | Freq: Every day | ORAL | 2 refills | Status: DC

## 2023-02-04 MED ORDER — PHENTERMINE HCL 37.5 MG PO TABS: 37.5000 mg | ORAL_TABLET | Freq: Every day | ORAL | 3 refills | Status: DC

## 2023-02-04 NOTE — Progress Notes (Signed)
Established Patient Office Visit  Subjective:  Patient ID: Sydney Torres, female    DOB: Jun 26, 1960  Age: 63 y.o. MRN: [id number]  Chief Complaint  Patient presents with   Leg Pain    Left leg pain    Patient comes in with 1 week history of left lower leg discomfort with cramps.  Patient states that her symptoms started at night usually when she is in bed and then she has to twist and turn and then sometimes needs to get out of bed and walk around before she gets comfortable again.  There is no swelling of the leg, no stasis, no redness, no tenderness and no temperature change.  She does have history of chronic back pain with sciatica, but currently her back is not bothering her.  Previously needed Medrol Dosepak for relief of her symptoms.  But currently the discomfort is localized to her left lower leg with mild arthritic pain in her left knee.  She has a prescription for baclofen which she has been taking but says it did not help.  Advised to take it with OTC Aleve with food.   Will also consider nerve impingement and start treatment with gabapentin.  There can be a possibility of restless legs as well, however will start with gabapentin.  Patient advised to soak her feet in hot water before bedtime.  Venous Dopplers are not indicated at this time.    No other concerns at this time.   Past Medical History:  Diagnosis Date   Arthritis    Depression    Diabetes (HCC)    Hyperlipemia    Hypertension    Urinary frequency     Past Surgical History:  Procedure Laterality Date   HERNIA REPAIR  2010    Social History   Socioeconomic History   Marital status: Married    Spouse name: Not on file   Number of children: Not on file   Years of education: Not on file   Highest education level: Not on file  Occupational History   Not on file  Tobacco Use   Smoking status: Never   Smokeless tobacco: Never  Substance and Sexual Activity   Alcohol use: No    Alcohol/week: 0.0 standard  drinks of alcohol   Drug use: No   Sexual activity: Not on file  Other Topics Concern   Not on file  Social History Narrative   Not on file   Social Drivers of Health   Financial Resource Strain: Not on file  Food Insecurity: Not on file  Transportation Needs: Not on file  Physical Activity: Not on file  Stress: Not on file  Social Connections: Not on file  Intimate Partner Violence: Not on file    Family History  Problem Relation Age of Onset   Kidney failure Father    Kidney failure Mother    Kidney failure Brother    Kidney cancer Neg Hx    Prostate cancer Neg Hx    Bladder Cancer Neg Hx    Breast cancer Neg Hx     No Known Allergies  Outpatient Medications Prior to Visit  Medication Sig   baclofen (LIORESAL) 10 MG tablet Take 1 tablet (10 mg total) by mouth at bedtime.   enalapril (VASOTEC) 10 MG tablet Take 1 tablet (10 mg total) by mouth daily.   metFORMIN (GLUCOPHAGE-XR) 500 MG 24 hr tablet Take 1 tablet (500 mg total) by mouth 2 (two) times daily with a meal.   phentermine (  ADIPEX-P) 37.5 MG tablet Take 1 tablet (37.5 mg total) by mouth daily before breakfast.   rosuvastatin (CRESTOR) 20 MG tablet Take 1 tablet (20 mg total) by mouth daily.   solifenacin (VESICARE) 10 MG tablet Take 1 tablet (10 mg total) by mouth daily.   venlafaxine XR (EFFEXOR XR) 37.5 MG 24 hr capsule Take 1 capsule (37.5 mg total) by mouth daily.   Vitamin D, Ergocalciferol, (DRISDOL) 1.25 MG (50000 UNIT) CAPS capsule Take 1 capsule (50,000 Units total) by mouth once a week.   No facility-administered medications prior to visit.    Review of Systems  Constitutional: Negative.  Negative for chills, diaphoresis, fever, malaise/fatigue and weight loss.  HENT: Negative.  Negative for congestion and sinus pain.   Eyes: Negative.   Respiratory: Negative.  Negative for cough, shortness of breath and stridor.   Cardiovascular: Negative.  Negative for chest pain, palpitations and leg swelling.   Gastrointestinal: Negative.  Negative for abdominal pain, constipation, diarrhea, heartburn, nausea and vomiting.  Genitourinary: Negative.  Negative for dysuria and flank pain.  Musculoskeletal: Negative.  Negative for joint pain and myalgias.  Skin: Negative.   Neurological: Negative.  Negative for dizziness, tingling, tremors, speech change and headaches.  Endo/Heme/Allergies: Negative.   Psychiatric/Behavioral: Negative.  Negative for depression and suicidal ideas. The patient is not nervous/anxious.        Objective:   BP 118/68   Pulse (!) 102   Ht 5\' 4"  (1.626 m)   Wt 176 lb 12.8 oz (80.2 kg)   SpO2 95%   BMI 30.35 kg/m   Vitals:   02/04/23 1318  BP: 118/68  Pulse: (!) 102  Height: 5\' 4"  (1.626 m)  Weight: 176 lb 12.8 oz (80.2 kg)  SpO2: 95%  BMI (Calculated): 30.33    Physical Exam Vitals and nursing note reviewed.  Constitutional:      Appearance: Normal appearance.  HENT:     Head: Normocephalic and atraumatic.     Nose: Nose normal.     Mouth/Throat:     Mouth: Mucous membranes are moist.     Pharynx: Oropharynx is clear.  Eyes:     Conjunctiva/sclera: Conjunctivae normal.     Pupils: Pupils are equal, round, and reactive to light.  Cardiovascular:     Rate and Rhythm: Normal rate and regular rhythm.     Pulses: Normal pulses.     Heart sounds: Normal heart sounds. No murmur heard. Pulmonary:     Effort: Pulmonary effort is normal.     Breath sounds: Normal breath sounds. No wheezing.  Abdominal:     General: Bowel sounds are normal.     Palpations: Abdomen is soft.     Tenderness: There is no abdominal tenderness. There is no right CVA tenderness or left CVA tenderness.  Musculoskeletal:        General: Normal range of motion.     Cervical back: Normal range of motion.     Right lower leg: No edema.     Left lower leg: No edema.  Skin:    General: Skin is warm and dry.  Neurological:     General: No focal deficit present.     Mental  Status: She is alert and oriented to person, place, and time.  Psychiatric:        Mood and Affect: Mood normal.        Behavior: Behavior normal.      Results for orders placed or performed in visit on 02/04/23  POCT CBG (Fasting - Glucose)  Result Value Ref Range   Glucose Fasting, POC 140 (A) 70 - 99 mg/dL    Recent Results (from the past 2160 hours)  [id number]     Status: Abnormal   Collection Time: 12/10/22 10:06 AM  Result Value Ref Range   Glucose 113 (H) 70 - 99 mg/dL   BUN 21 8 - 27 mg/dL   Creatinine, Ser 1.61 0.57 - 1.00 mg/dL   eGFR 85 >09 UE/AVW/0.98   BUN/Creatinine Ratio 27 12 - 28   Sodium 143 134 - 144 mmol/L   Potassium 4.6 3.5 - 5.2 mmol/L   Chloride 104 96 - 106 mmol/L   CO2 24 20 - 29 mmol/L   Calcium 9.3 8.7 - 10.3 mg/dL   Total Protein 7.6 6.0 - 8.5 g/dL   Albumin 4.3 3.9 - 4.9 g/dL   Globulin, Total 3.3 1.5 - 4.5 g/dL   Bilirubin Total 0.5 0.0 - 1.2 mg/dL   Alkaline Phosphatase 79 44 - 121 IU/L   AST 19 0 - 40 IU/L   ALT 15 0 - 32 IU/L  Hemoglobin A1c     Status: Abnormal   Collection Time: 12/10/22 10:06 AM  Result Value Ref Range   Hgb A1c MFr Bld 7.1 (H) 4.8 - 5.6 %    Comment:          Prediabetes: 5.7 - 6.4          Diabetes: >6.4          Glycemic control for adults with diabetes: <7.0    Est. average glucose Bld gHb Est-mCnc 157 mg/dL  Lipid panel     Status: None   Collection Time: 12/10/22 10:06 AM  Result Value Ref Range   Cholesterol, Total 132 100 - 199 mg/dL   Triglycerides 119 0 - 149 mg/dL   HDL 46 >14 mg/dL   VLDL Cholesterol Cal 22 5 - 40 mg/dL   LDL Chol Calc (NIH) 64 0 - 99 mg/dL   Chol/HDL Ratio 2.9 0.0 - 4.4 ratio    Comment:                                   T. Chol/HDL Ratio                                             Men  Women                               1/2 Avg.Risk  3.4    3.3                                   Avg.Risk  5.0    4.4                                2X Avg.Risk  9.6    7.1                                 3X Avg.Risk 23.4   11.0   POCT  Glucose (CBG)     Status: Abnormal   Collection Time: 12/14/22  1:25 PM  Result Value Ref Range   POC Glucose 103 (A) 70 - 99 mg/dl  Urine Culture     Status: None   Collection Time: 12/14/22  2:55 PM   Specimen: Urine   UC  Result Value Ref Range   Urine Culture, Routine Final report    Organism ID, Bacteria Comment     Comment: Culture shows less than 10,000 colony forming units of bacteria per milliliter of urine. This colony count is not generally considered to be clinically significant.   POCT Urinalysis Dipstick (21308)     Status: Abnormal   Collection Time: 12/14/22  3:03 PM  Result Value Ref Range   Color, UA yellow    Clarity, UA clear    Glucose, UA Negative Negative   Bilirubin, UA negative    Ketones, UA negative    Spec Grav, UA 1.025 1.010 - 1.025   Blood, UA negative    pH, UA 5.5 5.0 - 8.0   Protein, UA Negative Negative   Urobilinogen, UA 1.0 0.2 or 1.0 E.U./dL   Nitrite, UA negative    Leukocytes, UA Trace (A) Negative   Appearance     Odor    POC CREATINE & ALBUMIN,URINE     Status: Abnormal   Collection Time: 12/14/22  3:05 PM  Result Value Ref Range   Microalbumin Ur, POC 80 mg/L   Creatinine, POC 300 mg/dL   Albumin/Creatinine Ratio, Urine, POC 30-300   POCT Glucose (CBG)     Status: Abnormal   Collection Time: 12/28/22  2:29 PM  Result Value Ref Range   POC Glucose 105 (A) 70 - 99 mg/dl  POCT CBG (Fasting - Glucose)     Status: Abnormal   Collection Time: 02/04/23  1:22 PM  Result Value Ref Range   Glucose Fasting, POC 140 (A) 70 - 99 mg/dL      Assessment & Plan:  Patient to continue all her medications.  Add gabapentin at bedtime.  Can take her baclofen along with OTC Aleve.  Monitor for any acute changes. Problem List Items Addressed This Visit     Type 2 diabetes mellitus with hyperglycemia, without long-term current use of insulin (HCC)   Relevant Orders   POCT CBG (Fasting - Glucose)  (Completed)   Obesity (BMI 30.0-34.9)   Hypertension associated with diabetes (HCC)   GAD (generalized anxiety disorder)   Low back pain with left-sided sciatica - Primary   Relevant Medications   gabapentin (NEURONTIN) 100 MG capsule   Myalgia   Relevant Medications   gabapentin (NEURONTIN) 100 MG capsule    Return in about 4 weeks (around 03/04/2023).   Total time spent: 25 minutes  [name], MD  02/04/2023   This document may have been prepared by [facility] Voice Recognition software and as such may include unintentional dictation errors.

## 2023-03-15 ENCOUNTER — Encounter: Payer: Self-pay | Admitting: Internal Medicine

## 2023-03-15 ENCOUNTER — Ambulatory Visit: Payer: No Typology Code available for payment source | Admitting: Internal Medicine

## 2023-03-15 VITALS — BP 118/74 | HR 104 | Ht 64.0 in | Wt 177.6 lb

## 2023-03-15 DIAGNOSIS — M5442 Lumbago with sciatica, left side: Secondary | ICD-10-CM | POA: Diagnosis not present

## 2023-03-15 DIAGNOSIS — M25562 Pain in left knee: Secondary | ICD-10-CM

## 2023-03-15 DIAGNOSIS — E1165 Type 2 diabetes mellitus with hyperglycemia: Secondary | ICD-10-CM

## 2023-03-15 DIAGNOSIS — E1169 Type 2 diabetes mellitus with other specified complication: Secondary | ICD-10-CM

## 2023-03-15 DIAGNOSIS — E66811 Obesity, class 1: Secondary | ICD-10-CM

## 2023-03-15 DIAGNOSIS — E1159 Type 2 diabetes mellitus with other circulatory complications: Secondary | ICD-10-CM

## 2023-03-15 DIAGNOSIS — E782 Mixed hyperlipidemia: Secondary | ICD-10-CM

## 2023-03-15 DIAGNOSIS — I152 Hypertension secondary to endocrine disorders: Secondary | ICD-10-CM

## 2023-03-15 LAB — POCT CBG (FASTING - GLUCOSE)-MANUAL ENTRY: Glucose Fasting, POC: 121 mg/dL — AB (ref 70–99)

## 2023-03-15 MED ORDER — GABAPENTIN 300 MG PO CAPS
300.0000 mg | ORAL_CAPSULE | Freq: Three times a day (TID) | ORAL | 2 refills | Status: DC
Start: 2023-03-15 — End: 2023-03-23

## 2023-03-15 NOTE — Progress Notes (Signed)
 Established Patient Office Visit  Subjective:  Patient ID: Sydney Torres, female    DOB: 23-Aug-1960  Age: 63 y.o. MRN: [id number]  Chief Complaint  Patient presents with   Follow-up    4 week follow up    Patient comes in with complaints of left knee pain as well as pain in her leg and lower back.  Mentions that she feels restless legs especially at night which wakes her up from sleep.  Currently she is on gabapentin 100 mg, will increase it to 300 mg at bedtime.  Also referral sent to orthopedic for her left knee pain. She is currently on Adipex but has not lost weight.  Taking all the rest of medications regularly.    No other concerns at this time.   Past Medical History:  Diagnosis Date   Arthritis    Depression    Diabetes (HCC)    Hyperlipemia    Hypertension    Urinary frequency     Past Surgical History:  Procedure Laterality Date   HERNIA REPAIR  2010    Social History   Socioeconomic History   Marital status: Married    Spouse name: Not on file   Number of children: Not on file   Years of education: Not on file   Highest education level: Not on file  Occupational History   Not on file  Tobacco Use   Smoking status: Never   Smokeless tobacco: Never  Substance and Sexual Activity   Alcohol use: No    Alcohol/week: 0.0 standard drinks of alcohol   Drug use: No   Sexual activity: Not on file  Other Topics Concern   Not on file  Social History Narrative   Not on file   Social Drivers of Health   Financial Resource Strain: Not on file  Food Insecurity: Not on file  Transportation Needs: Not on file  Physical Activity: Not on file  Stress: Not on file  Social Connections: Not on file  Intimate Partner Violence: Not on file    Family History  Problem Relation Age of Onset   Kidney failure Father    Kidney failure Mother    Kidney failure Brother    Kidney cancer Neg Hx    Prostate cancer Neg Hx    Bladder Cancer Neg Hx    Breast cancer Neg  Hx     No Known Allergies  Outpatient Medications Prior to Visit  Medication Sig   baclofen (LIORESAL) 10 MG tablet TAKE 1 TABLET BY MOUTH AT BEDTIME   enalapril (VASOTEC) 10 MG tablet Take 1 tablet (10 mg total) by mouth daily.   gabapentin (NEURONTIN) 100 MG capsule Take 1 capsule (100 mg total) by mouth daily.   metFORMIN (GLUCOPHAGE-XR) 500 MG 24 hr tablet Take 1 tablet (500 mg total) by mouth 2 (two) times daily with a meal.   phentermine (ADIPEX-P) 37.5 MG tablet Take 1 tablet (37.5 mg total) by mouth daily before breakfast.   rosuvastatin (CRESTOR) 20 MG tablet Take 1 tablet (20 mg total) by mouth daily.   solifenacin (VESICARE) 10 MG tablet Take 1 tablet (10 mg total) by mouth daily.   venlafaxine XR (EFFEXOR XR) 37.5 MG 24 hr capsule Take 1 capsule (37.5 mg total) by mouth daily.   Vitamin D, Ergocalciferol, (DRISDOL) 1.25 MG (50000 UNIT) CAPS capsule Take 1 capsule (50,000 Units total) by mouth once a week.   No facility-administered medications prior to visit.    Review of Systems  Constitutional: Negative.  Negative for chills, fever, malaise/fatigue and weight loss.  HENT: Negative.  Negative for nosebleeds.   Eyes: Negative.   Respiratory: Negative.  Negative for cough, shortness of breath and stridor.   Cardiovascular: Negative.  Negative for chest pain, palpitations and leg swelling.  Gastrointestinal: Negative.  Negative for abdominal pain, constipation, diarrhea, heartburn, nausea and vomiting.  Genitourinary: Negative.  Negative for dysuria and flank pain.  Musculoskeletal:  Positive for back pain and joint pain. Negative for myalgias.  Skin: Negative.   Neurological: Negative.  Negative for dizziness and headaches.  Endo/Heme/Allergies: Negative.   Psychiatric/Behavioral: Negative.  Negative for depression and suicidal ideas. The patient is not nervous/anxious.        Objective:   BP 118/74   Pulse (!) 104   Ht 5\' 4"  (1.626 m)   Wt 177 lb 9.6 oz (80.6 kg)    SpO2 97%   BMI 30.48 kg/m   Vitals:   03/15/23 1402  BP: 118/74  Pulse: (!) 104  Height: 5\' 4"  (1.626 m)  Weight: 177 lb 9.6 oz (80.6 kg)  SpO2: 97%  BMI (Calculated): 30.47    Physical Exam Vitals and nursing note reviewed.  Constitutional:      Appearance: Normal appearance.  HENT:     Head: Normocephalic and atraumatic.     Nose: Nose normal.     Mouth/Throat:     Mouth: Mucous membranes are moist.     Pharynx: Oropharynx is clear.  Eyes:     Conjunctiva/sclera: Conjunctivae normal.     Pupils: Pupils are equal, round, and reactive to light.  Cardiovascular:     Rate and Rhythm: Normal rate and regular rhythm.     Pulses: Normal pulses.     Heart sounds: Normal heart sounds. No murmur heard. Pulmonary:     Effort: Pulmonary effort is normal.     Breath sounds: Normal breath sounds. No wheezing.  Abdominal:     General: Bowel sounds are normal.     Palpations: Abdomen is soft.     Tenderness: There is no abdominal tenderness. There is no right CVA tenderness or left CVA tenderness.  Musculoskeletal:        General: Normal range of motion.     Cervical back: Normal range of motion.     Right lower leg: No edema.     Left lower leg: No edema.  Skin:    General: Skin is warm and dry.  Neurological:     General: No focal deficit present.     Mental Status: She is alert and oriented to person, place, and time.  Psychiatric:        Mood and Affect: Mood normal.        Behavior: Behavior normal.      Results for orders placed or performed in visit on 03/15/23  POCT CBG (Fasting - Glucose)  Result Value Ref Range   Glucose Fasting, POC 121 (A) 70 - 99 mg/dL    Recent Results (from the past 2160 hours)  POCT Glucose (CBG)     Status: Abnormal   Collection Time: 12/28/22  2:29 PM  Result Value Ref Range   POC Glucose 105 (A) 70 - 99 mg/dl  POCT CBG (Fasting - Glucose)     Status: Abnormal   Collection Time: 02/04/23  1:22 PM  Result Value Ref Range    Glucose Fasting, POC 140 (A) 70 - 99 mg/dL  POCT CBG (Fasting - Glucose)  Status: Abnormal   Collection Time: 03/15/23  2:07 PM  Result Value Ref Range   Glucose Fasting, POC 121 (A) 70 - 99 mg/dL      Assessment & Plan:  Increase gabapentin to 300 mg at bedtime.  Can take Tylenol for pain.  Orthopedic referral for left knee pain, may need steroid injections. Problem List Items Addressed This Visit     Type 2 diabetes mellitus with hyperglycemia, without long-term current use of insulin (HCC)   Relevant Orders   POCT CBG (Fasting - Glucose) (Completed)   Obesity (BMI 30.0-34.9)   Hypertension associated with diabetes (HCC)   Combined hyperlipidemia associated with type 2 diabetes mellitus (HCC)   Low back pain with left-sided sciatica   Relevant Medications   gabapentin (NEURONTIN) 300 MG capsule   Other Visit Diagnoses       Acute pain of left knee    -  Primary   Relevant Orders   Ambulatory referral to Orthopedic Surgery       Follow up as scheduled.  Total time spent: 30 minutes  [name], MD  03/15/2023   This document may have been prepared by [facility] Voice Recognition software and as such may include unintentional dictation errors.

## 2023-03-18 ENCOUNTER — Other Ambulatory Visit: Payer: Self-pay | Admitting: Internal Medicine

## 2023-03-18 DIAGNOSIS — F411 Generalized anxiety disorder: Secondary | ICD-10-CM

## 2023-03-23 ENCOUNTER — Other Ambulatory Visit: Payer: Self-pay

## 2023-03-23 ENCOUNTER — Telehealth: Payer: Self-pay

## 2023-03-23 ENCOUNTER — Other Ambulatory Visit

## 2023-03-23 DIAGNOSIS — E66811 Obesity, class 1: Secondary | ICD-10-CM

## 2023-03-23 DIAGNOSIS — I152 Hypertension secondary to endocrine disorders: Secondary | ICD-10-CM

## 2023-03-23 DIAGNOSIS — E559 Vitamin D deficiency, unspecified: Secondary | ICD-10-CM

## 2023-03-23 DIAGNOSIS — E1165 Type 2 diabetes mellitus with hyperglycemia: Secondary | ICD-10-CM

## 2023-03-23 DIAGNOSIS — E782 Mixed hyperlipidemia: Secondary | ICD-10-CM

## 2023-03-23 DIAGNOSIS — M5442 Lumbago with sciatica, left side: Secondary | ICD-10-CM

## 2023-03-23 MED ORDER — GABAPENTIN 300 MG PO CAPS: 300.0000 mg | ORAL_CAPSULE | Freq: Three times a day (TID) | ORAL | 2 refills | Status: DC

## 2023-03-23 NOTE — Telephone Encounter (Signed)
 Patient asked for iron labs to be added to her labs

## 2023-03-24 LAB — LIPID PANEL
Chol/HDL Ratio: 2.3 ratio (ref 0.0–4.4)
Cholesterol, Total: 119 mg/dL (ref 100–199)
HDL: 52 mg/dL (ref >39)
LDL Chol Calc (NIH): 47 mg/dL (ref 0–99)
Triglycerides: 111 mg/dL (ref 0–149)
VLDL Cholesterol Cal: 20 mg/dL (ref 5–40)

## 2023-03-24 LAB — CMP14+EGFR
ALT: 15 IU/L (ref 0–32)
AST: 21 IU/L (ref 0–40)
Albumin: 4.1 g/dL (ref 3.9–4.9)
Alkaline Phosphatase: 77 IU/L (ref 44–121)
BUN/Creatinine Ratio: 21 (ref 12–28)
BUN: 16 mg/dL (ref 8–27)
Bilirubin Total: 0.4 mg/dL (ref 0.0–1.2)
CO2: 25 mmol/L (ref 20–29)
Calcium: 9 mg/dL (ref 8.7–10.3)
Chloride: 104 mmol/L (ref 96–106)
Creatinine, Ser: 0.78 mg/dL (ref 0.57–1.00)
Globulin, Total: 2.9 g/dL (ref 1.5–4.5)
Glucose: 113 mg/dL — ABNORMAL HIGH (ref 70–99)
Potassium: 4.3 mmol/L (ref 3.5–5.2)
Sodium: 141 mmol/L (ref 134–144)
Total Protein: 7 g/dL (ref 6.0–8.5)
eGFR: 84 mL/min/{1.73_m2} (ref >59)

## 2023-03-24 LAB — VITAMIN D 25 HYDROXY (VIT D DEFICIENCY, FRACTURES): Vit D, 25-Hydroxy: 64.7 ng/mL (ref 30.0–100.0)

## 2023-03-24 LAB — TSH: TSH: 1.36 u[IU]/mL (ref 0.450–4.500)

## 2023-03-24 LAB — HEMOGLOBIN A1C
Est. average glucose Bld gHb Est-mCnc: 148 mg/dL
Hgb A1c MFr Bld: 6.8 % — ABNORMAL HIGH (ref 4.8–5.6)

## 2023-03-25 NOTE — Telephone Encounter (Signed)
 Added via [facility] DXA

## 2023-03-26 LAB — IRON AND TIBC
Iron Saturation: 12 % — ABNORMAL LOW (ref 15–55)
Iron: 49 ug/dL (ref 27–139)
Total Iron Binding Capacity: 419 ug/dL (ref 250–450)
UIBC: 370 ug/dL — ABNORMAL HIGH (ref 118–369)

## 2023-03-26 LAB — FERRITIN: Ferritin: 13 ng/mL — ABNORMAL LOW (ref 15–150)

## 2023-03-26 LAB — SPECIMEN STATUS REPORT

## 2023-03-29 ENCOUNTER — Encounter: Payer: Self-pay | Admitting: Internal Medicine

## 2023-03-29 ENCOUNTER — Ambulatory Visit: Payer: 59 | Admitting: Internal Medicine

## 2023-03-29 VITALS — BP 140/70 | HR 103 | Ht 64.0 in | Wt 178.4 lb

## 2023-03-29 DIAGNOSIS — M25562 Pain in left knee: Secondary | ICD-10-CM

## 2023-03-29 DIAGNOSIS — E1159 Type 2 diabetes mellitus with other circulatory complications: Secondary | ICD-10-CM | POA: Diagnosis not present

## 2023-03-29 DIAGNOSIS — E782 Mixed hyperlipidemia: Secondary | ICD-10-CM

## 2023-03-29 DIAGNOSIS — E66811 Obesity, class 1: Secondary | ICD-10-CM

## 2023-03-29 DIAGNOSIS — E1165 Type 2 diabetes mellitus with hyperglycemia: Secondary | ICD-10-CM | POA: Diagnosis not present

## 2023-03-29 DIAGNOSIS — E1169 Type 2 diabetes mellitus with other specified complication: Secondary | ICD-10-CM | POA: Diagnosis not present

## 2023-03-29 DIAGNOSIS — I152 Hypertension secondary to endocrine disorders: Secondary | ICD-10-CM

## 2023-03-29 LAB — POCT CBG (FASTING - GLUCOSE)-MANUAL ENTRY: Glucose Fasting, POC: 111 mg/dL — AB (ref 70–99)

## 2023-03-29 NOTE — Progress Notes (Signed)
 Established Patient Office Visit  Subjective:  Patient ID: Sydney Torres, female    DOB: 07-14-1960  Age: 63 y.o. MRN: [id number]  Chief Complaint  Patient presents with   Follow-up    3 month follow up    Patient comes in for her follow-up today.  She still gets left knee pain intermittently and uses Voltaren gel, Tylenol.  She was referred to an orthopedic but they do not take her insurance.  Will send another referral to [facility].  Gabapentin is helping her with aches and pains and is able to sleep better at night.  However she continues to gain weight.  She is hoping that since she sleeps through the night she will not be eating snacks when her sleep is disturbed.  Labs were done which show slightly low ferritin.  Will start oral iron supplement.  Her hemoglobin A1c is improved.  Needs to monitor blood pressure.    No other concerns at this time.   Past Medical History:  Diagnosis Date   Arthritis    Depression    Diabetes (HCC)    Hyperlipemia    Hypertension    Urinary frequency     Past Surgical History:  Procedure Laterality Date   HERNIA REPAIR  2010    Social History   Socioeconomic History   Marital status: Married    Spouse name: Not on file   Number of children: Not on file   Years of education: Not on file   Highest education level: Not on file  Occupational History   Not on file  Tobacco Use   Smoking status: Never   Smokeless tobacco: Never  Substance and Sexual Activity   Alcohol use: No    Alcohol/week: 0.0 standard drinks of alcohol   Drug use: No   Sexual activity: Not on file  Other Topics Concern   Not on file  Social History Narrative   Not on file   Social Drivers of Health   Financial Resource Strain: Not on file  Food Insecurity: Not on file  Transportation Needs: Not on file  Physical Activity: Not on file  Stress: Not on file  Social Connections: Not on file  Intimate Partner Violence: Not on file    Family History   Problem Relation Age of Onset   Kidney failure Father    Kidney failure Mother    Kidney failure Brother    Kidney cancer Neg Hx    Prostate cancer Neg Hx    Bladder Cancer Neg Hx    Breast cancer Neg Hx     No Known Allergies  Outpatient Medications Prior to Visit  Medication Sig   baclofen (LIORESAL) 10 MG tablet TAKE 1 TABLET BY MOUTH AT BEDTIME   enalapril (VASOTEC) 10 MG tablet Take 1 tablet (10 mg total) by mouth daily.   gabapentin (NEURONTIN) 300 MG capsule Take 1 capsule (300 mg total) by mouth 3 (three) times daily.   metFORMIN (GLUCOPHAGE-XR) 500 MG 24 hr tablet Take 1 tablet (500 mg total) by mouth 2 (two) times daily with a meal.   phentermine (ADIPEX-P) 37.5 MG tablet Take 1 tablet (37.5 mg total) by mouth daily before breakfast.   rosuvastatin (CRESTOR) 20 MG tablet Take 1 tablet (20 mg total) by mouth daily.   solifenacin (VESICARE) 10 MG tablet Take 1 tablet (10 mg total) by mouth daily.   venlafaxine XR (EFFEXOR-XR) 37.5 MG 24 hr capsule Take 1 capsule by mouth once daily   Vitamin  D, Ergocalciferol, (DRISDOL) 1.25 MG (50000 UNIT) CAPS capsule Take 1 capsule (50,000 Units total) by mouth once a week.   [DISCONTINUED] gabapentin (NEURONTIN) 100 MG capsule Take 1 capsule (100 mg total) by mouth daily. (Patient not taking: Reported on 03/29/2023)   No facility-administered medications prior to visit.    Review of Systems  Constitutional: Negative.  Negative for chills, fever, malaise/fatigue and weight loss.  HENT: Negative.  Negative for sore throat.   Eyes: Negative.   Respiratory: Negative.  Negative for cough and shortness of breath.   Cardiovascular: Negative.  Negative for chest pain, palpitations and leg swelling.  Gastrointestinal: Negative.  Negative for abdominal pain, constipation, diarrhea, heartburn, nausea and vomiting.  Genitourinary: Negative.  Negative for dysuria and flank pain.  Musculoskeletal: Negative.  Negative for joint pain and myalgias.   Skin: Negative.   Neurological: Negative.  Negative for dizziness, tingling, tremors and headaches.  Endo/Heme/Allergies: Negative.   Psychiatric/Behavioral: Negative.  Negative for depression and suicidal ideas. The patient is not nervous/anxious.        Objective:   BP (!) 140/70   Pulse (!) 103   Ht 5\' 4"  (1.626 m)   Wt 178 lb 6.4 oz (80.9 kg)   SpO2 99%   BMI 30.62 kg/m   Vitals:   03/29/23 1333  BP: (!) 140/70  Pulse: (!) 103  Height: 5\' 4"  (1.626 m)  Weight: 178 lb 6.4 oz (80.9 kg)  SpO2: 99%  BMI (Calculated): 30.61    Physical Exam Vitals and nursing note reviewed.  Constitutional:      Appearance: Normal appearance.  HENT:     Head: Normocephalic and atraumatic.     Nose: Nose normal.     Mouth/Throat:     Mouth: Mucous membranes are moist.     Pharynx: Oropharynx is clear.  Eyes:     Conjunctiva/sclera: Conjunctivae normal.     Pupils: Pupils are equal, round, and reactive to light.  Cardiovascular:     Rate and Rhythm: Normal rate and regular rhythm.     Pulses: Normal pulses.     Heart sounds: Normal heart sounds. No murmur heard. Pulmonary:     Effort: Pulmonary effort is normal.     Breath sounds: Normal breath sounds. No wheezing.  Abdominal:     General: Bowel sounds are normal.     Palpations: Abdomen is soft.     Tenderness: There is no abdominal tenderness. There is no right CVA tenderness or left CVA tenderness.  Musculoskeletal:        General: Normal range of motion.     Cervical back: Normal range of motion.     Right lower leg: No edema.     Left lower leg: No edema.  Skin:    General: Skin is warm and dry.  Neurological:     General: No focal deficit present.     Mental Status: She is alert and oriented to person, place, and time.  Psychiatric:        Mood and Affect: Mood normal.        Behavior: Behavior normal.      Results for orders placed or performed in visit on 03/29/23  POCT CBG (Fasting - Glucose)  Result  Value Ref Range   Glucose Fasting, POC 111 (A) 70 - 99 mg/dL    Recent Results (from the past 2160 hours)  POCT CBG (Fasting - Glucose)     Status: Abnormal   Collection Time: 02/04/23  1:22 PM  Result Value Ref Range   Glucose Fasting, POC 140 (A) 70 - 99 mg/dL  POCT CBG (Fasting - Glucose)     Status: Abnormal   Collection Time: 03/15/23  2:07 PM  Result Value Ref Range   Glucose Fasting, POC 121 (A) 70 - 99 mg/dL  Hemoglobin H8I     Status: Abnormal   Collection Time: 03/23/23 10:04 AM  Result Value Ref Range   Hgb A1c MFr Bld 6.8 (H) 4.8 - 5.6 %    Comment:          Prediabetes: 5.7 - 6.4          Diabetes: >6.4          Glycemic control for adults with diabetes: <7.0    Est. average glucose Bld gHb Est-mCnc 148 mg/dL  TSH     Status: None   Collection Time: 03/23/23 10:04 AM  Result Value Ref Range   TSH 1.360 0.450 - 4.500 uIU/mL  CMP14+EGFR     Status: Abnormal   Collection Time: 03/23/23 10:04 AM  Result Value Ref Range   Glucose 113 (H) 70 - 99 mg/dL   BUN 16 8 - 27 mg/dL   Creatinine, Ser 6.96 0.57 - 1.00 mg/dL   eGFR 84 >29 BM/WUX/3.24   BUN/Creatinine Ratio 21 12 - 28   Sodium 141 134 - 144 mmol/L   Potassium 4.3 3.5 - 5.2 mmol/L   Chloride 104 96 - 106 mmol/L   CO2 25 20 - 29 mmol/L   Calcium 9.0 8.7 - 10.3 mg/dL   Total Protein 7.0 6.0 - 8.5 g/dL   Albumin 4.1 3.9 - 4.9 g/dL   Globulin, Total 2.9 1.5 - 4.5 g/dL   Bilirubin Total 0.4 0.0 - 1.2 mg/dL   Alkaline Phosphatase 77 44 - 121 IU/L   AST 21 0 - 40 IU/L   ALT 15 0 - 32 IU/L  Lipid panel     Status: None   Collection Time: 03/23/23 10:04 AM  Result Value Ref Range   Cholesterol, Total 119 100 - 199 mg/dL   Triglycerides 401 0 - 149 mg/dL   HDL 52 >02 mg/dL   VLDL Cholesterol Cal 20 5 - 40 mg/dL   LDL Chol Calc (NIH) 47 0 - 99 mg/dL   Chol/HDL Ratio 2.3 0.0 - 4.4 ratio    Comment:                                   T. Chol/HDL Ratio                                             Men  Women                                1/2 Avg.Risk  3.4    3.3                                   Avg.Risk  5.0    4.4  2X Avg.Risk  9.6    7.1                                3X Avg.Risk 23.4   11.0   Vitamin D (25 hydroxy)     Status: None   Collection Time: 03/23/23 10:04 AM  Result Value Ref Range   Vit D, 25-Hydroxy 64.7 30.0 - 100.0 ng/mL    Comment: Vitamin D deficiency has been defined by the Institute of Medicine and an Endocrine Society practice guideline as a level of serum 25-OH vitamin D less than 20 ng/mL (1,2). The Endocrine Society went on to further define vitamin D insufficiency as a level between 21 and 29 ng/mL (2). 1. IOM (Institute of Medicine). 2010. Dietary reference    intakes for calcium and D. [place]: The    [employer]. 2. Holick MF, Binkley Fulton, Bischoff-Ferrari HA, et al.    Evaluation, treatment, and prevention of vitamin D    deficiency: an Endocrine Society clinical practice    guideline. JCEM. 2011 Jul; 96(7):1911-30.   Iron and TIBC     Status: Abnormal   Collection Time: 03/23/23 10:04 AM  Result Value Ref Range   Total Iron Binding Capacity 419 250 - 450 ug/dL   UIBC 621 (H) 308 - 657 ug/dL   Iron 49 27 - 846 ug/dL   Iron Saturation 12 (L) 15 - 55 %  Ferritin     Status: Abnormal   Collection Time: 03/23/23 10:04 AM  Result Value Ref Range   Ferritin 13 (L) 15 - 150 ng/mL  Specimen status report     Status: None   Collection Time: 03/23/23 10:04 AM  Result Value Ref Range   specimen status report Comment     Comment: Written Authorization Written Authorization Written Authorization Received. Authorization received from Hassell Halim for DXA Request on 03-25-2023 Logged by Adria Dill   POCT CBG (Fasting - Glucose)     Status: Abnormal   Collection Time: 03/29/23  1:37 PM  Result Value Ref Range   Glucose Fasting, POC 111 (A) 70 - 99 mg/dL      Assessment & Plan:  Patient to continue all her  medications.  Strict diet control emphasized.  Needs to monitor her blood pressure.  Start iron supplement.  Referral sent to [facility] clinic orthopedic for left knee pain. Problem List Items Addressed This Visit     Type 2 diabetes mellitus with hyperglycemia, without long-term current use of insulin (HCC) - Primary   Relevant Orders   POCT CBG (Fasting - Glucose) (Completed)   Obesity (BMI 30.0-34.9)   Hypertension associated with diabetes (HCC)   Combined hyperlipidemia associated with type 2 diabetes mellitus (HCC)   Other Visit Diagnoses       Acute pain of left knee       Relevant Orders   Ambulatory referral to Orthopedic Surgery       Follow up 3 months.  Total time spent: 30 minutes  [name], MD  03/29/2023   This document may have been prepared by [facility] Voice Recognition software and as such may include unintentional dictation errors.

## 2023-03-30 NOTE — Progress Notes (Signed)
 Patient notified

## 2023-04-13 ENCOUNTER — Telehealth: Payer: Self-pay | Admitting: Internal Medicine

## 2023-04-13 NOTE — Telephone Encounter (Signed)
Patient left VM requesting a call back. Did not state what she is needing.

## 2023-04-26 ENCOUNTER — Other Ambulatory Visit: Payer: Self-pay | Admitting: Internal Medicine

## 2023-04-26 ENCOUNTER — Telehealth: Payer: Self-pay | Admitting: Internal Medicine

## 2023-04-26 DIAGNOSIS — D508 Other iron deficiency anemias: Secondary | ICD-10-CM

## 2023-04-26 MED ORDER — FUSION PLUS PO CAPS: 1.0000 | ORAL_CAPSULE | Freq: Every day | ORAL | 6 refills | Status: DC

## 2023-04-26 NOTE — Telephone Encounter (Signed)
 Spoke with patient and she wants to know if she needs to continue the Fusion Plus iron. If so, can you send a Rx to [facility] on [address]

## 2023-05-06 ENCOUNTER — Other Ambulatory Visit: Payer: Self-pay | Admitting: Internal Medicine

## 2023-05-06 DIAGNOSIS — E559 Vitamin D deficiency, unspecified: Secondary | ICD-10-CM

## 2023-06-22 ENCOUNTER — Ambulatory Visit (HOSPITAL_BASED_OUTPATIENT_CLINIC_OR_DEPARTMENT_OTHER): Admitting: Student in an Organized Health Care Education/Training Program

## 2023-06-22 ENCOUNTER — Encounter: Payer: Self-pay | Admitting: Student in an Organized Health Care Education/Training Program

## 2023-06-22 ENCOUNTER — Ambulatory Visit
Admission: RE | Admit: 2023-06-22 | Discharge: 2023-06-22 | Disposition: A | Discharge status: Home / Self Care | Source: Ambulatory Visit | Attending: Student in an Organized Health Care Education/Training Program | Admitting: Student in an Organized Health Care Education/Training Program

## 2023-06-22 VITALS — BP 115/86 | HR 102 | Temp 98.6°F | Resp 16 | Ht 63.0 in | Wt 178.0 lb

## 2023-06-22 DIAGNOSIS — M1712 Unilateral primary osteoarthritis, left knee: Secondary | ICD-10-CM | POA: Insufficient documentation

## 2023-06-22 DIAGNOSIS — G8929 Other chronic pain: Secondary | ICD-10-CM | POA: Insufficient documentation

## 2023-06-22 DIAGNOSIS — G894 Chronic pain syndrome: Secondary | ICD-10-CM | POA: Diagnosis not present

## 2023-06-22 DIAGNOSIS — M25562 Pain in left knee: Secondary | ICD-10-CM | POA: Insufficient documentation

## 2023-06-22 MED ORDER — DICLOFENAC SODIUM 75 MG PO TBEC: DELAYED_RELEASE_TABLET | ORAL | 0 refills | Status: DC

## 2023-06-22 NOTE — Progress Notes (Signed)
 PROVIDER NOTE: Interpretation of information contained herein should be left to medically-trained personnel. Specific patient instructions are provided elsewhere under Patient Instructions section of medical record. This document was created in part using AI and STT-dictation technology, any transcriptional errors that may result from this process are unintentional.  Patient: Sydney Torres  Service: E/M Encounter  Provider: Cephus Collin, MD  DOB: [birth year]  Delivery: Face-to-face  Specialty: Interventional Pain Management  MRN: [id number]  Setting: Ambulatory outpatient facility  Specialty designation: 09  Type: New Patient  Location: Outpatient office facility  PCP: Aisha Hove, MD  DOS: 06/22/2023    Referring Prov.: Aisha Hove, MD   Primary Reason(s) for Visit: Encounter for initial evaluation of one or more chronic problems (new to examiner) potentially causing chronic pain, and posing a threat to normal musculoskeletal function. (Level of risk: High) CC: Knee Pain (left)  HPI  Sydney Torres is a 63 y.o. year old, female patient, who comes for the first time to our practice referred by Aisha Hove, MD for our initial evaluation of her chronic pain. She has Type 2 diabetes mellitus with hyperglycemia, without long-term current use of insulin (HCC); Vitamin D  deficiency; Mixed hyperlipidemia; Essential hypertension, benign; Urge incontinence of urine; Obesity (BMI 30.0-34.9); Hypertension associated with diabetes (HCC); Combined hyperlipidemia associated with type 2 diabetes mellitus (HCC); GAD (generalized anxiety disorder); Low back pain with left-sided sciatica; Myalgia; Chronic pain of left knee; Primary osteoarthritis of left knee; and Chronic pain syndrome on their problem list. Today she comes in for evaluation of her Knee Pain (left)  Pain Assessment: Location: Left Knee Radiating: up and down leg Onset:   Duration: Chronic pain Quality:   Severity: 8 /10 (subjective, self-reported  pain score)  Effect on ADL:   Timing:   Modifying factors:   BP: 115/86  HR: (!) 102  Onset and Duration: Gradual and Present longer than 3 months Cause of pain: Trauma Severity: Getting worse, NAS-11 at its worse: 10/10, NAS-11 at its best: 7/10, NAS-11 now: 7/10, and NAS-11 on the average: 7/10 Timing: Not influenced by the time of the day, After activity or exercise, and After a period of immobility Aggravating Factors: Bending, Climbing, Kneeling, Motion, Stooping , Walking, Walking uphill, and Walking downhill Alleviating Factors: Cold packs, Lying down, and Resting Associated Problems: Depression, Fatigue, Swelling, Tingling, Weakness, and Pain that wakes patient up Quality of Pain: Constant, Disabling, and Horrible Previous Examinations or Tests: X-rays Previous Treatments: Trigger point injections  Sydney Torres is being evaluated for possible interventional pain management therapies for the treatment of her chronic pain.   Discussed the use of AI scribe software for clinical note transcription with the patient, who gave verbal consent to proceed.  History of Present Illness   Sydney Torres is a 63 year old female who presents with left knee pain.  She has been experiencing left knee pain that radiates up and down the leg since a fall in a parking lot approximately two years ago. An x-ray at that time showed no abnormalities, but the pain has persisted. She has not undergone any physical therapy or received injections for her knee, nor has she used anti-inflammatory medications.  About two months ago, she began experiencing sciatica pain, which she believes has worsened her knee pain. She is currently taking gabapentin  300 mg once at night to aid with sleep, but the sciatica pain has not fully resolved.  She is on medication for hypertension and hypercholesterolemia. Although not diabetic, she takes metformin  once a  day. She tries to avoid putting weight on her left knee due to the pain,  which sometimes causes discomfort in her right knee when compensating.  Her family history includes kidney problems, but she states her kidneys are currently functioning well. She moved to the area two years ago and has family living in [place].  No issues with her right knee.       Meds   Current Outpatient Medications:    baclofen  (LIORESAL ) 10 MG tablet, TAKE 1 TABLET BY MOUTH AT BEDTIME, Disp: 30 tablet, Rfl: 0   diclofenac (VOLTAREN) 75 MG EC tablet, Take 1 tablet (75 mg total) by mouth 2 (two) times daily for 15 days, THEN 1 tablet (75 mg total) daily for 15 days., Disp: 45 tablet, Rfl: 0   enalapril  (VASOTEC ) 10 MG tablet, Take 1 tablet (10 mg total) by mouth daily., Disp: 90 tablet, Rfl: 3   gabapentin  (NEURONTIN ) 300 MG capsule, Take 1 capsule (300 mg total) by mouth 3 (three) times daily., Disp: 90 capsule, Rfl: 2   Iron-FA-B Cmp-C-Biot-Probiotic (FUSION PLUS) CAPS, Take 1 capsule by mouth daily., Disp: 30 capsule, Rfl: 6   metFORMIN  (GLUCOPHAGE -XR) 500 MG 24 hr tablet, Take 1 tablet (500 mg total) by mouth 2 (two) times daily with a meal., Disp: 180 tablet, Rfl: 3   phentermine  (ADIPEX-P ) 37.5 MG tablet, Take 1 tablet (37.5 mg total) by mouth daily before breakfast., Disp: 30 tablet, Rfl: 3   rosuvastatin  (CRESTOR ) 20 MG tablet, Take 1 tablet (20 mg total) by mouth daily., Disp: 90 tablet, Rfl: 3   solifenacin  (VESICARE ) 10 MG tablet, Take 1 tablet (10 mg total) by mouth daily., Disp: 30 tablet, Rfl: 2   venlafaxine  XR (EFFEXOR -XR) 37.5 MG 24 hr capsule, Take 1 capsule by mouth once daily, Disp: 90 capsule, Rfl: 3   Vitamin D , Ergocalciferol , (DRISDOL ) 1.25 MG (50000 UNIT) CAPS capsule, Take 1 capsule by mouth once a week, Disp: 12 capsule, Rfl: 3  Imaging Review   DG Knee 1-2 Views Left  Narrative CLINICAL DATA:  Acute left knee pain. Patient reports fall a few weeks ago with pain anteriorly.  EXAM: LEFT KNEE - 1-2 VIEW  COMPARISON:  None Available.  FINDINGS: No  fracture or dislocation. There is medial tibiofemoral joint space narrowing. Tricompartmental peripheral spurring, moderate in the patellofemoral compartment. Minimal knee joint effusion but no lipohemarthrosis. Small quadriceps tendon enthesophyte. Slight prepatellar soft tissue thickening  IMPRESSION: 1. No fracture or dislocation of the left knee. 2. Tricompartmental osteoarthritis, moderate in the patellofemoral compartment.   Electronically Signed By: Chadwick Colonel M.D. On: 09/26/2022 08:32    Complexity Note: Imaging results reviewed.                         ROS  Cardiovascular: No reported cardiovascular signs or symptoms such as High blood pressure, coronary artery disease, abnormal heart rate or rhythm, heart attack, blood thinner therapy or heart weakness and/or failure Pulmonary or Respiratory: No reported pulmonary signs or symptoms such as wheezing and difficulty taking a deep full breath (Asthma), difficulty blowing air out (Emphysema), coughing up mucus (Bronchitis), persistent dry cough, or temporary stoppage of breathing during sleep Neurological: No reported neurological signs or symptoms such as seizures, abnormal skin sensations, urinary and/or fecal incontinence, being born with an abnormal open spine and/or a tethered spinal cord Psychological-Psychiatric: Difficulty sleeping and or falling asleep and No reported psychological or psychiatric signs or symptoms such as difficulty sleeping, anxiety,  depression, delusions or hallucinations (schizophrenial), mood swings (bipolar disorders) or suicidal ideations or attempts Gastrointestinal: No reported gastrointestinal signs or symptoms such as vomiting or evacuating blood, reflux, heartburn, alternating episodes of diarrhea and constipation, inflamed or scarred liver, or pancreas or irrregular and/or infrequent bowel movements Genitourinary: No reported renal or genitourinary signs or symptoms such as difficulty  voiding or producing urine, peeing blood, non-functioning kidney, kidney stones, difficulty emptying the bladder, difficulty controlling the flow of urine, or chronic kidney disease Hematological: Bleeding easily Endocrine: No reported endocrine signs or symptoms such as high or low blood sugar, rapid heart rate due to high thyroid levels, obesity or weight gain due to slow thyroid or thyroid disease Rheumatologic: Rheumatoid arthritis and Generalized muscle inflammation and weakness (Polymyositis) Musculoskeletal: Negative for myasthenia gravis, muscular dystrophy, multiple sclerosis or malignant hyperthermia Work History: Homemaker  Allergies  Sydney Torres has no known allergies.  Laboratory Chemistry Profile   Renal Lab Results  Component Value Date   BUN 16 03/23/2023   CREATININE 0.78 03/23/2023   BCR 21 03/23/2023   SPECGRAV 1.025 12/14/2022   PHUR 5.5 12/14/2022   PROTEINUR Negative 12/14/2022     Electrolytes Lab Results  Component Value Date   NA 141 03/23/2023   K 4.3 03/23/2023   CL 104 03/23/2023   CALCIUM  9.0 03/23/2023     Hepatic Lab Results  Component Value Date   AST 21 03/23/2023   ALT 15 03/23/2023   ALBUMIN 4.1 03/23/2023   ALKPHOS 77 03/23/2023     ID No results found for: LYMEIGGIGMAB, HIV, SARSCOV2NAA, STAPHAUREUS, MRSAPCR, HCVAB, PREGTESTUR, RMSFIGG, QFVRPH1IGG, QFVRPH2IGG   Bone Lab Results  Component Value Date   VD25OH 64.7 03/23/2023     Endocrine Lab Results  Component Value Date   GLUCOSE 113 (H) 03/23/2023   HGBA1C 6.8 (H) 03/23/2023   TSH 1.360 03/23/2023     Neuropathy Lab Results  Component Value Date   HGBA1C 6.8 (H) 03/23/2023     CNS No results found for: COLORCSF, APPEARCSF, RBCCOUNTCSF, WBCCSF, POLYSCSF, LYMPHSCSF, EOSCSF, PROTEINCSF, GLUCCSF, JCVIRUS, CSFOLI, IGGCSF, LABACHR, ACETBL   Inflammation (CRP: Acute  ESR: Chronic) No results found for: CRP, ESRSEDRATE,  LATICACIDVEN   Rheumatology No results found for: RF, ANA, LABURIC, URICUR, LYMEIGGIGMAB, LYMEABIGMQN, HLAB27   Coagulation No results found for: INR, LABPROT, APTT, PLT, DDIMER, LABHEMA, VITAMINK1, AT3   Cardiovascular No results found for: BNP, CKTOTAL, CKMB, TROPONINI, HGB, HCT, LABVMA, EPIRU, EPINEPH24HUR, NOREPRU, NOREPI24HUR, DOPARU, DOPAM24HRUR   Screening No results found for: SARSCOV2NAA, COVIDSOURCE, STAPHAUREUS, MRSAPCR, HCVAB, HIV, PREGTESTUR   Cancer No results found for: CEA, CA125, LABCA2   Allergens No results found for: ALMOND, APPLE, ASPARAGUS, AVOCADO, BANANA, BARLEY, BASIL, BAYLEAF, GREENBEAN, LIMABEAN, WHITEBEAN, BEEFIGE, REDBEET, BLUEBERRY, BROCCOLI, CABBAGE, MELON, CARROT, CASEIN, CASHEWNUT, CAULIFLOWER, CELERY     Note: Lab results reviewed.  PFSH  Drug: Sydney Torres  reports no history of drug use. Alcohol:  reports no history of alcohol use. Tobacco:  reports that she has never smoked. She has never used smokeless tobacco. Medical:  has a past medical history of Arthritis, Depression, Diabetes (HCC), Hyperlipemia, Hypertension, and Urinary frequency. Family: family history includes Kidney failure in her brother, father, and mother.  Past Surgical History:  Procedure Laterality Date   HERNIA REPAIR  2010   Active Ambulatory Problems    Diagnosis Date Noted   Type 2 diabetes mellitus with hyperglycemia, without long-term current use of insulin (HCC) 07/13/2022   Vitamin D  deficiency 07/13/2022   Mixed  hyperlipidemia 07/13/2022   Essential hypertension, benign 07/13/2022   Urge incontinence of urine 09/15/2022   Obesity (BMI 30.0-34.9) 10/13/2022   Hypertension associated with diabetes (HCC) 12/15/2022   Combined hyperlipidemia associated with type 2 diabetes mellitus (HCC) 12/15/2022   GAD (generalized anxiety disorder) 12/15/2022   Low  back pain with left-sided sciatica 02/04/2023   Myalgia 02/04/2023   Chronic pain of left knee 06/22/2023   Primary osteoarthritis of left knee 06/22/2023   Chronic pain syndrome 06/22/2023   Resolved Ambulatory Problems    Diagnosis Date Noted   No Resolved Ambulatory Problems   Past Medical History:  Diagnosis Date   Arthritis    Depression    Diabetes (HCC)    Hyperlipemia    Hypertension    Urinary frequency    Constitutional Exam  General appearance: Well nourished, well developed, and well hydrated. In no apparent acute distress Vitals:   06/22/23 1149  BP: 115/86  Pulse: (!) 102  Resp: 16  Temp: 98.6 F (37 C)  SpO2: 98%  Weight: 178 lb (80.7 kg)  Height: 5' 3 (1.6 m)   BMI Assessment: Estimated body mass index is 31.53 kg/m as calculated from the following:   Height as of this encounter: 5' 3 (1.6 m).   Weight as of this encounter: 178 lb (80.7 kg).  BMI interpretation table: BMI level Category Range association with higher incidence of chronic pain  <18 kg/m2 Underweight   18.5-24.9 kg/m2 Ideal body weight   25-29.9 kg/m2 Overweight Increased incidence by 20%  30-34.9 kg/m2 Obese (Class I) Increased incidence by 68%  35-39.9 kg/m2 Severe obesity (Class II) Increased incidence by 136%  >40 kg/m2 Extreme obesity (Class III) Increased incidence by 254%   Patient's current BMI Ideal Body weight  Body mass index is 31.53 kg/m. Ideal body weight: 52.4 kg (115 lb 8.3 oz) Adjusted ideal body weight: 63.7 kg (140 lb 8.2 oz)   BMI Readings from Last 4 Encounters:  06/22/23 31.53 kg/m  03/29/23 30.62 kg/m  03/15/23 30.48 kg/m  02/04/23 30.35 kg/m   Wt Readings from Last 4 Encounters:  06/22/23 178 lb (80.7 kg)  03/29/23 178 lb 6.4 oz (80.9 kg)  03/15/23 177 lb 9.6 oz (80.6 kg)  02/04/23 176 lb 12.8 oz (80.2 kg)    Psych/Mental status: Alert, oriented x 3 (person, place, & time)       Eyes: PERLA Respiratory: No evidence of acute respiratory  distress  Left knee pain, worse with weightbearing   Assessment  Primary Diagnosis & Pertinent Problem List: The primary encounter diagnosis was Chronic pain of left knee. Diagnoses of Primary osteoarthritis of left knee and Chronic pain syndrome were also pertinent to this visit.  Visit Diagnosis (New problems to examiner): 1. Chronic pain of left knee   2. Primary osteoarthritis of left knee   3. Chronic pain syndrome    Plan of Care (Initial workup plan)  Assessment and Plan    Left knee pain   Chronic left knee pain persists for two years following a fall, with recent worsening possibly linked to sciatica. A previous x-ray in September 2024 showed moderate OA. Gabapentin  is used for sciatica-related pain. She had done a home based exercise program for her knee by PCP. Discussed Diclofenac, for knee pain management. Order an x-ray of the left knee. Prescribe diclofenac 75 mg twice daily for two weeks, then once daily for two weeks. Schedule a follow-up in two weeks for a potential steroid injection in the left  knee.  Sciatica   Sciatica is managed with gabapentin  300 mg at night. No acute exacerbation reported.  Diabetes mellitus   Diabetes mellitus is managed with metformin . Blood sugar levels occasionally fluctuate.  Hypertension   Hypertension is managed with medication.  Hyperlipidemia   Hyperlipidemia is managed with medication.        Imaging Orders         DG Knee Complete 4 Views Left      Procedure Orders         KNEE INJECTION     Pharmacotherapy (current): Medications ordered:  Meds ordered this encounter  Medications   diclofenac (VOLTAREN) 75 MG EC tablet    Sig: Take 1 tablet (75 mg total) by mouth 2 (two) times daily for 15 days, THEN 1 tablet (75 mg total) daily for 15 days.    Dispense:  45 tablet    Refill:  0   Medications administered during this visit: Sydney Torres had no medications administered during this visit.    Provider-requested  follow-up: Return in about 15 days (around 07/07/2023) for Left knee steroid injection.  Future Appointments  Date Time Provider Department Center  06/29/2023  1:30 PM Aisha Hove, MD [facility] None  07/07/2023  1:20 PM Cephus Collin, MD [facility] None   I discussed the assessment and treatment plan with the patient. The patient was provided an opportunity to ask questions and all were answered. The patient agreed with the plan and demonstrated an understanding of the instructions.  Patient advised to call back or seek an in-person evaluation if the symptoms or condition worsens.  Duration of encounter: .  Total time on encounter, as per AMA guidelines included both the face-to-face and non-face-to-face time personally spent by the physician and/or other qualified health care professional(s) on the day of the encounter (includes time in activities that require the physician or other qualified health care professional and does not include time in activities normally performed by clinical staff). Physician's time may include the following activities when performed: Preparing to see the patient (e.g., pre-charting review of records, searching for previously ordered imaging, lab work, and nerve conduction tests) Review of prior analgesic pharmacotherapies. Reviewing PMP Interpreting ordered tests (e.g., lab work, imaging, nerve conduction tests) Performing post-procedure evaluations, including interpretation of diagnostic procedures Obtaining and/or reviewing separately obtained history Performing a medically appropriate examination and/or evaluation Counseling and educating the patient/family/caregiver Ordering medications, tests, or procedures Referring and communicating with other health care professionals (when not separately reported) Documenting clinical information in the electronic or other health record Independently interpreting results (not separately reported) and communicating  results to the patient/ family/caregiver Care coordination (not separately reported)  Note by: Cephus Collin, MD (TTS and AI technology used. I apologize for any typographical errors that were not detected and corrected.) Date: 06/22/2023; Time: 1:22 PM

## 2023-06-22 NOTE — Patient Instructions (Signed)

## 2023-06-22 NOTE — Progress Notes (Signed)
 Safety precautions to be maintained throughout the outpatient stay will include: orient to surroundings, keep bed in low position, maintain call bell within reach at all times, provide assistance with transfer out of bed and ambulation.

## 2023-06-25 ENCOUNTER — Other Ambulatory Visit

## 2023-06-25 DIAGNOSIS — E782 Mixed hyperlipidemia: Secondary | ICD-10-CM

## 2023-06-25 DIAGNOSIS — E1159 Type 2 diabetes mellitus with other circulatory complications: Secondary | ICD-10-CM

## 2023-06-25 DIAGNOSIS — E66811 Obesity, class 1: Secondary | ICD-10-CM

## 2023-06-25 DIAGNOSIS — E559 Vitamin D deficiency, unspecified: Secondary | ICD-10-CM

## 2023-06-25 DIAGNOSIS — E1165 Type 2 diabetes mellitus with hyperglycemia: Secondary | ICD-10-CM

## 2023-06-26 LAB — HEMOGLOBIN A1C
Est. average glucose Bld gHb Est-mCnc: 151 mg/dL
Hgb A1c MFr Bld: 6.9 % — ABNORMAL HIGH (ref 4.8–5.6)

## 2023-06-26 LAB — CMP14+EGFR
ALT: 23 IU/L (ref 0–32)
AST: 24 IU/L (ref 0–40)
Albumin: 4.1 g/dL (ref 3.9–4.9)
Alkaline Phosphatase: 83 IU/L (ref 44–121)
BUN/Creatinine Ratio: 26 (ref 12–28)
BUN: 19 mg/dL (ref 8–27)
Bilirubin Total: 0.3 mg/dL (ref 0.0–1.2)
CO2: 22 mmol/L (ref 20–29)
Calcium: 9 mg/dL (ref 8.7–10.3)
Chloride: 103 mmol/L (ref 96–106)
Creatinine, Ser: 0.73 mg/dL (ref 0.57–1.00)
Globulin, Total: 3 g/dL (ref 1.5–4.5)
Glucose: 137 mg/dL — ABNORMAL HIGH (ref 70–99)
Potassium: 4.6 mmol/L (ref 3.5–5.2)
Sodium: 139 mmol/L (ref 134–144)
Total Protein: 7.1 g/dL (ref 6.0–8.5)
eGFR: 91 mL/min/{1.73_m2} (ref >59)

## 2023-06-26 LAB — VITAMIN D 25 HYDROXY (VIT D DEFICIENCY, FRACTURES): Vit D, 25-Hydroxy: 50.7 ng/mL (ref 30.0–100.0)

## 2023-06-26 LAB — LIPID PANEL
Chol/HDL Ratio: 2.8 ratio (ref 0.0–4.4)
Cholesterol, Total: 141 mg/dL (ref 100–199)
HDL: 51 mg/dL (ref >39)
LDL Chol Calc (NIH): 69 mg/dL (ref 0–99)
Triglycerides: 119 mg/dL (ref 0–149)
VLDL Cholesterol Cal: 21 mg/dL (ref 5–40)

## 2023-06-26 LAB — TSH: TSH: 1.03 u[IU]/mL (ref 0.450–4.500)

## 2023-06-28 ENCOUNTER — Ambulatory Visit: Payer: Self-pay | Admitting: Internal Medicine

## 2023-06-29 ENCOUNTER — Ambulatory Visit: Admitting: Internal Medicine

## 2023-07-01 ENCOUNTER — Encounter: Payer: Self-pay | Admitting: Internal Medicine

## 2023-07-01 ENCOUNTER — Ambulatory Visit: Admitting: Internal Medicine

## 2023-07-01 ENCOUNTER — Ambulatory Visit: Payer: Self-pay | Admitting: Internal Medicine

## 2023-07-01 VITALS — BP 124/76 | HR 94 | Ht 64.0 in | Wt 185.4 lb

## 2023-07-01 DIAGNOSIS — E1165 Type 2 diabetes mellitus with hyperglycemia: Secondary | ICD-10-CM

## 2023-07-01 DIAGNOSIS — M25562 Pain in left knee: Secondary | ICD-10-CM | POA: Diagnosis not present

## 2023-07-01 DIAGNOSIS — Z0130 Encounter for examination of blood pressure without abnormal findings: Secondary | ICD-10-CM

## 2023-07-01 LAB — POCT CBG (FASTING - GLUCOSE)-MANUAL ENTRY: Glucose Fasting, POC: 104 mg/dL — AB (ref 70–99)

## 2023-07-01 NOTE — Progress Notes (Signed)
 Established Patient Office Visit  Subjective:  Patient ID: Sydney Torres, female    DOB: 1960-06-18  Age: 63 y.o. MRN: [id number]  Chief Complaint  Patient presents with   Follow-up    3 month follow up    Patient comes in for follow-up today.  She recently had blood work done and the results were discussed.  Hemoglobin A1c is 6.9 now.  Rest of the labs are stable.  Patient complains of left knee pain which is preventing her from her usual physical activity.  Recently she was seen by the pain management and started on diclofenac tablets.  She is noticing some improvement but she is also scheduled for a steroid injection in the left knee.  Patient advised to control her diet very strictly while trying to get some more physical activity and.  She was not able to tolerate phentermine  if taken daily.    No other concerns at this time.   Past Medical History:  Diagnosis Date   Arthritis    Depression    Diabetes (HCC)    Hyperlipemia    Hypertension    Urinary frequency     Past Surgical History:  Procedure Laterality Date   HERNIA REPAIR  2010    Social History   Socioeconomic History   Marital status: Married    Spouse name: Not on file   Number of children: Not on file   Years of education: Not on file   Highest education level: Not on file  Occupational History   Not on file  Tobacco Use   Smoking status: Never   Smokeless tobacco: Never  Substance and Sexual Activity   Alcohol use: No    Alcohol/week: 0.0 standard drinks of alcohol   Drug use: No   Sexual activity: Not on file  Other Topics Concern   Not on file  Social History Narrative   Not on file   Social Drivers of Health   Financial Resource Strain: Not on file  Food Insecurity: Not on file  Transportation Needs: Not on file  Physical Activity: Not on file  Stress: Not on file  Social Connections: Not on file  Intimate Partner Violence: Not on file    Family History  Problem Relation Age of  Onset   Kidney failure Father    Kidney failure Mother    Kidney failure Brother    Kidney cancer Neg Hx    Prostate cancer Neg Hx    Bladder Cancer Neg Hx    Breast cancer Neg Hx     No Known Allergies  Outpatient Medications Prior to Visit  Medication Sig   diclofenac (VOLTAREN) 75 MG EC tablet Take 1 tablet (75 mg total) by mouth 2 (two) times daily for 15 days, THEN 1 tablet (75 mg total) daily for 15 days.   enalapril  (VASOTEC ) 10 MG tablet Take 1 tablet (10 mg total) by mouth daily.   gabapentin  (NEURONTIN ) 300 MG capsule Take 1 capsule (300 mg total) by mouth 3 (three) times daily. (Patient taking differently: Take 300 mg by mouth at bedtime.)   Iron-FA-B Cmp-C-Biot-Probiotic (FUSION PLUS) CAPS Take 1 capsule by mouth daily.   metFORMIN  (GLUCOPHAGE -XR) 500 MG 24 hr tablet Take 1 tablet (500 mg total) by mouth 2 (two) times daily with a meal.   phentermine  (ADIPEX-P ) 37.5 MG tablet Take 1 tablet (37.5 mg total) by mouth daily before breakfast.   rosuvastatin  (CRESTOR ) 20 MG tablet Take 1 tablet (20 mg total) by mouth daily.  solifenacin  (VESICARE ) 10 MG tablet Take 1 tablet (10 mg total) by mouth daily.   venlafaxine  XR (EFFEXOR -XR) 37.5 MG 24 hr capsule Take 1 capsule by mouth once daily   Vitamin D , Ergocalciferol , (DRISDOL ) 1.25 MG (50000 UNIT) CAPS capsule Take 1 capsule by mouth once a week   baclofen  (LIORESAL ) 10 MG tablet TAKE 1 TABLET BY MOUTH AT BEDTIME (Patient not taking: Reported on 07/01/2023)   No facility-administered medications prior to visit.    Review of Systems  Constitutional: Negative.  Negative for chills, fever and malaise/fatigue.  HENT: Negative.  Negative for sore throat.   Eyes: Negative.   Respiratory: Negative.  Negative for cough and shortness of breath.   Cardiovascular: Negative.  Negative for chest pain, palpitations and leg swelling.  Gastrointestinal: Negative.  Negative for abdominal pain, constipation, diarrhea, heartburn, nausea and  vomiting.  Genitourinary: Negative.  Negative for dysuria and flank pain.  Musculoskeletal:  Positive for joint pain (Left Knee). Negative for myalgias.  Skin: Negative.   Neurological: Negative.  Negative for dizziness and headaches.  Endo/Heme/Allergies: Negative.   Psychiatric/Behavioral: Negative.  Negative for depression and suicidal ideas. The patient is not nervous/anxious.        Objective:   BP 124/76   Pulse 94   Ht 5' 4 (1.626 m)   Wt 185 lb 6.4 oz (84.1 kg)   SpO2 96%   BMI 31.82 kg/m   Vitals:   07/01/23 1118  BP: 124/76  Pulse: 94  Height: 5' 4 (1.626 m)  Weight: 185 lb 6.4 oz (84.1 kg)  SpO2: 96%  BMI (Calculated): 31.81    Physical Exam   Results for orders placed or performed in visit on 07/01/23  POCT CBG (Fasting - Glucose)  Result Value Ref Range   Glucose Fasting, POC 104 (A) 70 - 99 mg/dL    Recent Results (from the past 2160 hours)  Vitamin D  (25 hydroxy)     Status: None   Collection Time: 06/25/23  9:47 AM  Result Value Ref Range   Vit D, 25-Hydroxy 50.7 30.0 - 100.0 ng/mL    Comment: Vitamin D  deficiency has been defined by the Institute of Medicine and an Endocrine Society practice guideline as a level of serum 25-OH vitamin D  less than 20 ng/mL (1,2). The Endocrine Society went on to further define vitamin D  insufficiency as a level between 21 and 29 ng/mL (2). 1. IOM (Institute of Medicine). 2010. Dietary reference    intakes for calcium  and D. [place]: The    [employer]. 2. Holick MF, Binkley [place], Bischoff-Ferrari HA, et al.    Evaluation, treatment, and prevention of vitamin D     deficiency: an Endocrine Society clinical practice    guideline. JCEM. 2011 Jul; 96(7):1911-30.   Lipid panel     Status: None   Collection Time: 06/25/23  9:47 AM  Result Value Ref Range   Cholesterol, Total 141 100 - 199 mg/dL   Triglycerides 880 0 - 149 mg/dL   HDL 51 >60 mg/dL   VLDL Cholesterol Cal 21 5 - 40 mg/dL   LDL  Chol Calc (NIH) 69 0 - 99 mg/dL   Chol/HDL Ratio 2.8 0.0 - 4.4 ratio    Comment:                                   T. Chol/HDL Ratio  Men  Women                               1/2 Avg.Risk  3.4    3.3                                   Avg.Risk  5.0    4.4                                2X Avg.Risk  9.6    7.1                                3X Avg.Risk 23.4   11.0   CMP14+EGFR     Status: Abnormal   Collection Time: 06/25/23  9:47 AM  Result Value Ref Range   Glucose 137 (H) 70 - 99 mg/dL   BUN 19 8 - 27 mg/dL   Creatinine, Ser 9.26 0.57 - 1.00 mg/dL   eGFR 91 >40 fO/fpw/8.26   BUN/Creatinine Ratio 26 12 - 28   Sodium 139 134 - 144 mmol/L   Potassium 4.6 3.5 - 5.2 mmol/L   Chloride 103 96 - 106 mmol/L   CO2 22 20 - 29 mmol/L   Calcium  9.0 8.7 - 10.3 mg/dL   Total Protein 7.1 6.0 - 8.5 g/dL   Albumin 4.1 3.9 - 4.9 g/dL   Globulin, Total 3.0 1.5 - 4.5 g/dL   Bilirubin Total 0.3 0.0 - 1.2 mg/dL   Alkaline Phosphatase 83 44 - 121 IU/L   AST 24 0 - 40 IU/L   ALT 23 0 - 32 IU/L  TSH     Status: None   Collection Time: 06/25/23  9:47 AM  Result Value Ref Range   TSH 1.030 0.450 - 4.500 uIU/mL  Hemoglobin A1c     Status: Abnormal   Collection Time: 06/25/23  9:47 AM  Result Value Ref Range   Hgb A1c MFr Bld 6.9 (H) 4.8 - 5.6 %    Comment:          Prediabetes: 5.7 - 6.4          Diabetes: >6.4          Glycemic control for adults with diabetes: <7.0    Est. average glucose Bld gHb Est-mCnc 151 mg/dL  POCT CBG (Fasting - Glucose)     Status: Abnormal   Collection Time: 07/01/23 11:23 AM  Result Value Ref Range   Glucose Fasting, POC 104 (A) 70 - 99 mg/dL      Assessment & Plan:  Continue medications.  Strict diet control emphasized.  Patient will increase her physical activity once left knee pain is brought under control. Problem List Items Addressed This Visit     Type 2 diabetes mellitus with hyperglycemia, without long-term  current use of insulin (HCC) - Primary   Relevant Orders   POCT CBG (Fasting - Glucose) (Completed)    Return in about 3 months (around 10/01/2023).   Total time spent: 30 minutes  FERNAND FREDY RAMAN, MD  07/01/2023   This document may have been prepared by [facility] Voice Recognition software and as such may include unintentional dictation errors.

## 2023-07-07 ENCOUNTER — Other Ambulatory Visit: Payer: Self-pay | Admitting: Internal Medicine

## 2023-07-07 ENCOUNTER — Ambulatory Visit
Attending: Student in an Organized Health Care Education/Training Program | Admitting: Student in an Organized Health Care Education/Training Program

## 2023-07-07 ENCOUNTER — Encounter: Payer: Self-pay | Admitting: Student in an Organized Health Care Education/Training Program

## 2023-07-07 VITALS — BP 144/80 | HR 93 | Temp 99.1°F | Resp 16 | Ht 59.0 in | Wt 185.0 lb

## 2023-07-07 DIAGNOSIS — M1712 Unilateral primary osteoarthritis, left knee: Secondary | ICD-10-CM | POA: Insufficient documentation

## 2023-07-07 DIAGNOSIS — G894 Chronic pain syndrome: Secondary | ICD-10-CM | POA: Diagnosis present

## 2023-07-07 DIAGNOSIS — M25562 Pain in left knee: Secondary | ICD-10-CM | POA: Diagnosis present

## 2023-07-07 DIAGNOSIS — G8929 Other chronic pain: Secondary | ICD-10-CM | POA: Diagnosis present

## 2023-07-07 DIAGNOSIS — I10 Essential (primary) hypertension: Secondary | ICD-10-CM

## 2023-07-07 DIAGNOSIS — E782 Mixed hyperlipidemia: Secondary | ICD-10-CM

## 2023-07-07 MED ORDER — DICLOFENAC SODIUM 75 MG PO TBEC: 75.0000 mg | DELAYED_RELEASE_TABLET | Freq: Every day | ORAL | 1 refills | Status: DC | PRN

## 2023-07-07 MED ORDER — LIDOCAINE HCL 2 % IJ SOLN
20.0000 mL | Freq: Once | INTRAMUSCULAR | Status: AC
  Administered 2023-07-07: 100 mg

## 2023-07-07 MED ORDER — ROPIVACAINE HCL 2 MG/ML IJ SOLN
4.0000 mL | Freq: Once | INTRAMUSCULAR | Status: AC
  Administered 2023-07-07: 20 mL via PERINEURAL

## 2023-07-07 MED ORDER — METHYLPREDNISOLONE ACETATE 40 MG/ML IJ SUSP
40.0000 mg | Freq: Once | INTRAMUSCULAR | Status: AC
  Administered 2023-07-07: 40 mg

## 2023-07-07 MED ORDER — LIDOCAINE HCL (PF) 2 % IJ SOLN
INTRAMUSCULAR | Status: AC
  Filled 2023-07-07: qty 10

## 2023-07-07 MED ORDER — ROPIVACAINE HCL 2 MG/ML IJ SOLN
INTRAMUSCULAR | Status: AC
  Filled 2023-07-07: qty 20

## 2023-07-07 MED ORDER — METHYLPREDNISOLONE ACETATE 40 MG/ML IJ SUSP
INTRAMUSCULAR | Status: AC
  Filled 2023-07-07: qty 1

## 2023-07-07 NOTE — Progress Notes (Signed)
 Safety precautions to be maintained throughout the outpatient stay will include: orient to surroundings, keep bed in low position, maintain call bell within reach at all times, provide assistance with transfer out of bed and ambulation.

## 2023-07-07 NOTE — Progress Notes (Signed)
 PROVIDER NOTE: Interpretation of information contained herein should be left to medically-trained personnel. Specific patient instructions are provided elsewhere under Patient Instructions section of medical record. This document was created in part using STT-dictation technology, any transcriptional errors that may result from this process are unintentional.  Patient: Sydney Torres Type: Established DOB: November 07, 1960 MRN: [id number] PCP: Bathe Fredy RAMAN, MD  Service: Procedure DOS: 07/07/2023 Setting: Ambulatory Location: Ambulatory outpatient facility Delivery: Face-to-face Provider: Wallie Sherry, MD Specialty: Interventional Pain Management Specialty designation: 09 Location: Outpatient facility Ref. Prov.: Bathe Fredy RAMAN, MD       Interventional Therapy   Type:  Steroid Intra-articular Knee Injection #1  Laterality: Left (-LT) Level/approach: Medial Imaging guidance: None required ([id number]) Anesthesia: Local anesthesia (1-2% Lidocaine) DOS: 07/07/2023  Performed by: Wallie Sherry, MD  Purpose: Diagnostic/Therapeutic Indications: Knee arthralgia associated to osteoarthritis of the knee 1. Chronic pain of left knee   2. Primary osteoarthritis of left knee   3. Chronic pain syndrome    NAS-11 score:   Pre-procedure: 4 /10   Post-procedure: 4 /10     Pre-Procedure Preparation  Monitoring: As per clinic protocol.  Risk Assessment: Vitals:  AFP:Zdupfjuzi body mass index is 37.37 kg/m as calculated from the following:   Height as of this encounter: 4' 11 (1.499 m).   Weight as of this encounter: 185 lb (83.9 kg)., Rate:93 , BP:136/82, Resp:16, Temp:99.1 F (37.3 C), SpO2:97 %  Allergies: She has no known allergies.  Precautions: No additional precautions required  Blood-thinner(s): None at this time  Coagulopathies: Reviewed. None identified.   Active Infection(s): Reviewed. None identified. Sydney Torres is afebrile   Location setting: Exam room Position: Sitting w/ knee bent 90  degrees Safety Precautions: Patient was assessed for positional comfort and pressure points before starting the procedure. Prepping solution: DuraPrep (Iodine Povacrylex [0.7% available iodine] and Isopropyl Alcohol, 74% w/w) Prep Area: Entire knee region Approach: percutaneous, just above the tibial plateau, lateral to the infrapatellar tendon. Intended target: Intra-articular knee space Materials: Tray: Block Needle(s): Regular Qty: 1/side Length: 1.5-inch Gauge: 25G (x1) + 22G (x1)  Meds ordered this encounter  Medications   lidocaine (XYLOCAINE) 2 % (with pres) injection 400 mg   methylPREDNISolone  acetate (DEPO-MEDROL ) injection 40 mg   ropivacaine (PF) 2 mg/mL (0.2%) (NAROPIN) injection 4 mL   diclofenac  (VOLTAREN ) 75 MG EC tablet    Sig: Take 1 tablet (75 mg total) by mouth daily as needed.    Dispense:  30 tablet    Refill:  1    No orders of the defined types were placed in this encounter.    Time-out: 1331 I initiated and conducted the Time-out before starting the procedure, as per protocol. The patient was asked to participate by confirming the accuracy of the Time Out information. Verification of the correct person, site, and procedure were performed and confirmed by me, the nursing staff, and the patient. Time-out conducted as per Joint Commission's Universal Protocol (UP.01.01.01). Procedure checklist: Completed  H&P (Pre-op  Assessment)  Sydney Torres is a 63 y.o. (year old), female patient, seen today for interventional treatment. She  has a past surgical history that includes Hernia repair (2010). Sydney Torres has a current medication list which includes the following prescription(s): baclofen , enalapril , gabapentin , fusion plus, metformin , phentermine , rosuvastatin , venlafaxine  xr, vitamin d  (ergocalciferol ), diclofenac , and solifenacin . Her primarily concern today is the Knee Pain (left)  She has no known allergies.   Last encounter: My last encounter with her was on  06/22/2023. Pertinent problems:  Sydney Torres does not have any pertinent problems on file. Pain Assessment: Severity of Chronic pain is reported as a 4 /10. Location: Knee Left/sometimes up and down left leg. Onset: More than a month ago. Quality: Aching. Timing: Constant. Modifying factor(s): medications. Vitals:  height is 4' 11 (1.499 m) and weight is 185 lb (83.9 kg). Her temperature is 99.1 F (37.3 C). Her blood pressure is 144/80 (abnormal) and her pulse is 93. Her respiration is 16 and oxygen saturation is 97%.   Reason for encounter: interventional pain management therapy due pain of at least four (4) weeks in duration, with failure to respond and/or inability to tolerate more conservative care.  Site Confirmation: Sydney Torres was asked to confirm the procedure and laterality before marking the site.  Consent: Before the procedure and under the influence of no sedative(s), amnesic(s), or anxiolytics, the patient was informed of the treatment options, risks and possible complications. To fulfill our ethical and legal obligations, as recommended by the American Medical Association's Code of Ethics, I have informed the patient of my clinical impression; the nature and purpose of the treatment or procedure; the risks, benefits, and possible complications of the intervention; the alternatives, including doing nothing; the risk(s) and benefit(s) of the alternative treatment(s) or procedure(s); and the risk(s) and benefit(s) of doing nothing. The patient was provided information about the general risks and possible complications associated with the procedure. These may include, but are not limited to: failure to achieve desired goals, infection, bleeding, organ or nerve damage, allergic reactions, paralysis, and death. In addition, the patient was informed of those risks and complications associated to Spine-related procedures, such as failure to decrease pain; infection (i.e.: Meningitis, epidural or  intraspinal abscess); bleeding (i.e.: epidural hematoma, subarachnoid hemorrhage, or any other type of intraspinal or peri-dural bleeding); organ or nerve damage (i.e.: Any type of peripheral nerve, nerve root, or spinal cord injury) with subsequent damage to sensory, motor, and/or autonomic systems, resulting in permanent pain, numbness, and/or weakness of one or several areas of the body; allergic reactions; (i.e.: anaphylactic reaction); and/or death. Furthermore, the patient was informed of those risks and complications associated with the medications. These include, but are not limited to: allergic reactions (i.e.: anaphylactic or anaphylactoid reaction(s)); adrenal axis suppression; blood sugar elevation that in diabetics may result in ketoacidosis or comma; water retention that in patients with history of congestive heart failure may result in shortness of breath, pulmonary edema, and decompensation with resultant heart failure; weight gain; swelling or edema; medication-induced neural toxicity; particulate matter embolism and blood vessel occlusion with resultant organ, and/or nervous system infarction; and/or aseptic necrosis of one or more joints. Finally, the patient was informed that Medicine is not an exact science; therefore, there is also the possibility of unforeseen or unpredictable risks and/or possible complications that may result in a catastrophic outcome. The patient indicated having understood very clearly. We have given the patient no guarantees and we have made no promises. Enough time was given to the patient to ask questions, all of which were answered to the patient's satisfaction. Sydney Torres has indicated that she wanted to continue with the procedure. Attestation: I, the ordering provider, attest that I have discussed with the patient the benefits, risks, side-effects, alternatives, likelihood of achieving goals, and potential problems during recovery for the procedure that I have  provided informed consent.  Date  Time: 07/07/2023  1:11 PM  Description of procedure  Start Time: 1332 hrs  Local Anesthesia: Once the patient was  positioned, prepped, and time-out was completed. The target area was identified located. The skin was marked with an approved surgical skin marker. Once marked, the skin (epidermis, dermis, and hypodermis), and deeper tissues (fat, connective tissue and muscle) were infiltrated with a small amount of a short-acting local anesthetic, loaded on a 10cc syringe with a 25G, 1.5-in  Needle. An appropriate amount of time was allowed for local anesthetics to take effect before proceeding to the next step. Local Anesthetic: Lidocaine 1-2% The unused portion of the local anesthetic was discarded in the proper designated containers. Safety Precautions: Aspiration looking for blood return was conducted prior to all injections. At no point did I inject any substances, as a needle was being advanced. Before injecting, the patient was told to immediately notify me if she was experiencing any new onset of ringing in the ears, or metallic taste in the mouth. No attempts were made at seeking any paresthesias. Safe injection practices and needle disposal techniques used. Medications properly checked for expiration dates. SDV (single dose vial) medications used. After the completion of the procedure, all disposable equipment used was discarded in the proper designated medical waste containers.  Technical description: Protocol guidelines were followed. After positioning, the target area was identified and prepped in the usual manner. Skin & deeper tissues infiltrated with local anesthetic. Appropriate amount of time allowed to pass for local anesthetics to take effect. Proper needle placement secured. Once satisfactory needle placement was confirmed, I proceeded to inject the desired solution in slow, incremental fashion, intermittently assessing for discomfort or any signs of  abnormal or undesired spread of substance. Once completed, the needle was removed and disposed of, as per hospital protocols. The area was cleaned, making sure to leave some of the prepping solution back to take advantage of its long term bactericidal properties.  Aspiration:  Negative        Vitals:   07/07/23 1319 07/07/23 1323  BP: 136/82 (!) 144/80  Pulse: 93 93  Resp: 16 16  Temp: 99.1 F (37.3 C) 99.1 F (37.3 C)  SpO2: 97% 97%  Weight: 185 lb (83.9 kg)   Height: 4' 11 (1.499 m)     End Time: 1334 hrs  Imaging guidance  Imaging-assisted Technique: None required. Indication(s): N/A Exposure Time: N/A Contrast: None Fluoroscopic Guidance: N/A Ultrasound Guidance: N/A Interpretation: N/A  Post-op assessment  Post-procedure Vital Signs:  Pulse/HCG Rate: 93  Temp: 99.1 F (37.3 C) Resp: 16 BP: (!) 144/80 SpO2: 97 %  EBL: None  Complications: No immediate post-treatment complications observed by team, or reported by patient.  Note: The patient tolerated the entire procedure well. A repeat set of vitals were taken after the procedure and the patient was kept under observation following institutional policy, for this type of procedure. Post-procedural neurological assessment was performed, showing return to baseline, prior to discharge. The patient was provided with post-procedure discharge instructions, including a section on how to identify potential problems. Should any problems arise concerning this procedure, the patient was given instructions to immediately contact us , at any time, without hesitation. In any case, we plan to contact the patient by telephone for a follow-up status report regarding this interventional procedure.  Comments:  No additional relevant information.  Plan of care   Medications administered: We administered lidocaine, methylPREDNISolone  acetate, and ropivacaine (PF) 2 mg/mL (0.2%).  Follow-up plan:   Return in about 4 weeks (around  08/04/2023) for PPE, F2F.     Recent Visits Date Type Provider Dept  06/22/23  Office Visit Marcelino Nurse, MD [facility]  Showing recent visits within past 90 days and meeting all other requirements Today's Visits Date Type Provider Dept  07/07/23 Procedure visit Marcelino Nurse, MD [facility]  Showing today's visits and meeting all other requirements Future Appointments Date Type Provider Dept  08/05/23 Appointment Marcelino Nurse, MD [facility]  Showing future appointments within next 90 days and meeting all other requirements   Disposition: Discharge home  Discharge (Date  Time): 07/07/2023; 1338 hrs.   Primary Care Physician: Fernand Fredy RAMAN, MD Location: [facility] Out[facility] Facility Note by: Nurse Marcelino, MD Date: 07/07/2023; Time: 1:48 PM  DISCLAIMER: Medicine is not an exact science. It has no guarantees or warranties. The decision to proceed with this intervention was based on the information collected from the patient. Conclusions were drawn from the patient's questionnaire, interview, and examination. Because information was provided in large part by the patient, it cannot be guaranteed that it has not been purposely or unconsciously manipulated or altered. Every effort has been made to obtain as much accurate, relevant, available data as possible. Always take into account that the treatment will also be dependent on availability of resources and existing treatment guidelines, considered by other Pain Management Specialists as being common knowledge and practice, at the time of the intervention. It is also important to point out that variation in procedural techniques and pharmacological choices are the acceptable norm. For Medico-Legal review purposes, the indications, contraindications, technique, and results of the these procedures should only be evaluated, judged and interpreted by a Board-Certified Interventional Pain Specialist with extensive  familiarity and expertise in the same exact procedure and technique.

## 2023-07-08 ENCOUNTER — Telehealth: Payer: Self-pay | Admitting: *Deleted

## 2023-07-08 NOTE — Telephone Encounter (Signed)
 No problems post procedure.

## 2023-08-05 ENCOUNTER — Ambulatory Visit
Attending: Student in an Organized Health Care Education/Training Program | Admitting: Student in an Organized Health Care Education/Training Program

## 2023-08-05 ENCOUNTER — Encounter: Payer: Self-pay | Admitting: Student in an Organized Health Care Education/Training Program

## 2023-08-05 VITALS — BP 119/80 | HR 89 | Temp 99.5°F | Resp 16 | Ht 61.0 in | Wt 185.0 lb

## 2023-08-05 DIAGNOSIS — G894 Chronic pain syndrome: Secondary | ICD-10-CM | POA: Insufficient documentation

## 2023-08-05 DIAGNOSIS — G8929 Other chronic pain: Secondary | ICD-10-CM | POA: Diagnosis present

## 2023-08-05 DIAGNOSIS — M1712 Unilateral primary osteoarthritis, left knee: Secondary | ICD-10-CM | POA: Diagnosis present

## 2023-08-05 DIAGNOSIS — M25562 Pain in left knee: Secondary | ICD-10-CM | POA: Diagnosis present

## 2023-08-05 NOTE — Progress Notes (Signed)
 PROVIDER NOTE: Interpretation of information contained herein should be left to medically-trained personnel. Specific patient instructions are provided elsewhere under Patient Instructions section of medical record. This document was created in part using AI and STT-dictation technology, any transcriptional errors that may result from this process are unintentional.  Patient: Sydney Torres  Service: E/M   PCP: Torres Fredy RAMAN, MD  DOB: 05/18/1960  DOS: 08/05/2023  Provider: Wallie Sherry, MD  MRN: [id number]  Delivery: Face-to-face  Specialty: Interventional Pain Management  Type: Established Patient  Setting: Ambulatory outpatient facility  Specialty designation: 09  Referring Prov.: Torres Fredy RAMAN, MD  Location: Outpatient office facility       History of present illness (HPI) Ms. Sydney Torres, a 63 y.o. year old female, is here today because of her Chronic pain of left knee [M25.562, G89.29]. Ms. Boyden primary complain today is Knee Pain (left)   Pain Assessment: Severity of Chronic pain is reported as a 8  (when walking; 0 when sitting)/10. Location: Knee Left/sometimes to left thigh, sometimes to left shin. Onset: More than a month ago. Quality: Aching. Timing: Constant. Modifying factor(s): meds, injection, sitting. Vitals:  height is 5' 1 (1.549 m) and weight is 185 lb (83.9 kg). Her temperature is 99.5 F (37.5 C). Her blood pressure is 119/80 and her pulse is 89. Her respiration is 16 and oxygen saturation is 94%.  BMI: Estimated body mass index is 34.96 kg/m as calculated from the following:   Height as of this encounter: 5' 1 (1.549 m).   Weight as of this encounter: 185 lb (83.9 kg).  Last encounter: 06/22/2023. Last procedure: 07/07/2023.  Reason for encounter:   History of Present Illness   Sydney Torres is a 63 year old female who presents with knee pain and difficulty walking.  She experiences significant knee pain that radiates downwards, impacting her mobility and contributing to  weight gain due to decreased physical activity. The pain is severe enough to discourage movement, leading to a sedentary lifestyle.  She has tried various oral anti-inflammatory medications, including diclofenac , which she initially took twice daily but now takes once daily. Diclofenac  has been somewhat helpful in managing her pain.  She mentions a past experience with sciatica, which caused pain radiating down her leg. Currently, she experiences this type of pain only rarely, from her knee to her foot.  She has a family history of kidney problems, although no one in her family has experienced knee issues like hers.        ROS  Constitutional: Denies any fever or chills Gastrointestinal: No reported hemesis, hematochezia, vomiting, or acute GI distress Musculoskeletal: left knee pain Neurological: No reported episodes of acute onset apraxia, aphasia, dysarthria, agnosia, amnesia, paralysis, loss of coordination, or loss of consciousness  Medication Review  Fusion Plus, Vitamin D  (Ergocalciferol ), baclofen , diclofenac , enalapril , gabapentin , metFORMIN , phentermine , rosuvastatin , solifenacin , and venlafaxine  XR  History Review  Allergy: Ms. Sydney Torres has no known allergies. Drug: Ms. Sydney Torres  reports no history of drug use. Alcohol:  reports no history of alcohol use. Tobacco:  reports that she has never smoked. She has never used smokeless tobacco. Social: Ms. Sydney Torres  reports that she has never smoked. She has never used smokeless tobacco. She reports that she does not drink alcohol and does not use drugs. Medical:  has a past medical history of Arthritis, Depression, Diabetes (HCC), Hyperlipemia, Hypertension, and Urinary frequency. Surgical: Ms. Sydney Torres  has a past surgical history that includes Hernia repair (2010). Family: family history includes  Kidney failure in her brother, father, and mother.  Laboratory Chemistry Profile   Renal Lab Results  Component Value Date   BUN 19 06/25/2023    CREATININE 0.73 06/25/2023   BCR 26 06/25/2023    Hepatic Lab Results  Component Value Date   AST 24 06/25/2023   ALT 23 06/25/2023   ALBUMIN 4.1 06/25/2023   ALKPHOS 83 06/25/2023    Electrolytes Lab Results  Component Value Date   NA 139 06/25/2023   K 4.6 06/25/2023   CL 103 06/25/2023   CALCIUM  9.0 06/25/2023    Bone Lab Results  Component Value Date   VD25OH 50.7 06/25/2023    Inflammation (CRP: Acute Phase) (ESR: Chronic Phase) No results found for: CRP, ESRSEDRATE, LATICACIDVEN       Note: Above Lab results reviewed.  Recent Imaging Review  DG Knee Complete 4 Views Left CLINICAL DATA:  Left knee pain/arthralgia  EXAM: LEFT KNEE - COMPLETE 4+ VIEW  COMPARISON:  None Available.  FINDINGS: No evidence of fracture, dislocation, or joint effusion. There is tricompartmental joint space narrowing, sclerosis and osteophytes consistent with degenerative joint disease. No evidence of effusion.  IMPRESSION: Degenerative changes.  Electronically Signed   By: Fonda Field M.D.   On: 06/30/2023 21:10 Note: Reviewed        Physical Exam  Vitals: BP 119/80   Pulse 89   Temp 99.5 F (37.5 C)   Resp 16   Ht 5' 1 (1.549 m)   Wt 185 lb (83.9 kg)   SpO2 94%   BMI 34.96 kg/m  BMI: Estimated body mass index is 34.96 kg/m as calculated from the following:   Height as of this encounter: 5' 1 (1.549 m).   Weight as of this encounter: 185 lb (83.9 kg). Ideal: Ideal body weight: 47.8 kg (105 lb 6.1 oz) Adjusted ideal body weight: 62.2 kg (137 lb 3.6 oz) General appearance: Well nourished, well developed, and well hydrated. In no apparent acute distress Mental status: Alert, oriented x 3 (person, place, & time)       Respiratory: No evidence of acute respiratory distress Eyes: PERLA  Left knee pain- worse with WB Assessment   Diagnosis Status  1. Chronic pain of left knee   2. Primary osteoarthritis of left knee   3. Chronic pain syndrome     Controlled Controlled Controlled   Updated Problems: No problems updated.  Plan of Care  Problem-specific:  Assessment and Plan    Left knee osteoarthritis with chronic pain   Chronic pain in her left knee due to osteoarthritis is causing difficulty walking and weight gain. Alternative treatments such as gel injections to build cartilage and nerve blocks for pain management were considered. The limited evidence for repeated steroid injections if the initial response was inadequate was discussed. Gel injections as a series may improve cartilage and alleviate pain. The risks and benefits of gel injections versus steroid injections were discussed, with nerve blocks as a last resort. She prefers to avoid surgery, so non-surgical options were explored. Schedule a gel injection series for the left knee: Hyalgan, one injection per month for three months. Consider a genicular nerve block if gel injections are ineffective. Discuss the potential for a sciatic injection if leg pain worsens.       Ms. Wetona Viramontes has a current medication list which includes the following long-term medication(s): enalapril , gabapentin , metformin , phentermine , rosuvastatin , and venlafaxine  xr.  Pharmacotherapy (Medications Ordered): No orders of the defined types were placed in  this encounter.  Orders:  Orders Placed This Encounter  Procedures   KNEE INJECTION    Indications: Knee arthralgia (pain) due to osteoarthritis (OA) Imaging: None (CPT-20610) Position: Sitting Equipment/Materials: Block tray  1.5, 25-G (one per side)  Local anesthetic  Hyalgan (one per side)    Scheduling Instructions:     Procedure: Knee injection Hyalgan (Hyaluronan/Hyaluronic acid)     Treatment No.:1            Level: Intra-articular     Laterality: LEFT     Sedation: Patient's choice.    Where will this procedure be performed?:   [facility]    Return in about 6 days (around 08/11/2023) for Left knee hyalgan (gel), in  clinic [facility].    Recent Visits Date Type Provider Dept  07/07/23 Procedure visit Marcelino Nurse, MD [facility]  06/22/23 Office Visit Marcelino Nurse, MD [facility]  Showing recent visits within past 90 days and meeting all other requirements Today's Visits Date Type Provider Dept  08/05/23 Office Visit Marcelino Nurse, MD [facility]  Showing today's visits and meeting all other requirements Future Appointments No visits were found meeting these conditions. Showing future appointments within next 90 days and meeting all other requirements  I discussed the assessment and treatment plan with the patient. The patient was provided an opportunity to ask questions and all were answered. The patient agreed with the plan and demonstrated an understanding of the instructions.  Patient advised to call back or seek an in-person evaluation if the symptoms or condition worsens.  Duration of encounter: 20 minutes.  Total time on encounter, as per AMA guidelines included both the face-to-face and non-face-to-face time personally spent by the physician and/or other qualified health care professional(s) on the day of the encounter (includes time in activities that require the physician or other qualified health care professional and does not include time in activities normally performed by clinical staff). Physician's time may include the following activities when performed: Preparing to see the patient (e.g., pre-charting review of records, searching for previously ordered imaging, lab work, and nerve conduction tests) Review of prior analgesic pharmacotherapies. Reviewing PMP Interpreting ordered tests (e.g., lab work, imaging, nerve conduction tests) Performing post-procedure evaluations, including interpretation of diagnostic procedures Obtaining and/or reviewing separately obtained history Performing a medically appropriate examination and/or evaluation Counseling and  educating the patient/family/caregiver Ordering medications, tests, or procedures Referring and communicating with other health care professionals (when not separately reported) Documenting clinical information in the electronic or other health record Independently interpreting results (not separately reported) and communicating results to the patient/ family/caregiver Care coordination (not separately reported)  Note by: Nurse Marcelino, MD (TTS and AI technology used. I apologize for any typographical errors that were not detected and corrected.) Date: 08/05/2023; Time: 12:16 PM

## 2023-08-05 NOTE — Patient Instructions (Signed)
 GENERAL RISKS AND COMPLICATIONS  What are the risk, side effects and possible complications? Generally speaking, most procedures are safe.  However, with any procedure there are risks, side effects, and the possibility of complications.  The risks and complications are dependent upon the sites that are lesioned, or the type of nerve block to be performed.  The closer the procedure is to the spine, the more serious the risks are.  Great care is taken when placing the radio frequency needles, block needles or lesioning probes, but sometimes complications can occur. Infection: Any time there is an injection through the skin, there is a risk of infection.  This is why sterile conditions are used for these blocks.  There are four possible types of infection. Localized skin infection. Central Nervous System Infection-This can be in the form of Meningitis, which can be deadly. Epidural Infections-This can be in the form of an epidural abscess, which can cause pressure inside of the spine, causing compression of the spinal cord with subsequent paralysis. This would require an emergency surgery to decompress, and there are no guarantees that the patient would recover from the paralysis. Discitis-This is an infection of the intervertebral discs.  It occurs in about 1% of discography procedures.  It is difficult to treat and it may lead to surgery.        2. Pain: the needles have to go through skin and soft tissues, will cause soreness.       3. Damage to internal structures:  The nerves to be lesioned may be near blood vessels or    other nerves which can be potentially damaged.       4. Bleeding: Bleeding is more common if the patient is taking blood thinners such as  aspirin, Coumadin, Ticiid, Plavix, etc., or if he/she have some genetic predisposition  such as hemophilia. Bleeding into the spinal canal can cause compression of the spinal  cord with subsequent paralysis.  This would require an emergency  surgery to  decompress and there are no guarantees that the patient would recover from the  paralysis.       5. Pneumothorax:  Puncturing of a lung is a possibility, every time a needle is introduced in  the area of the chest or upper back.  Pneumothorax refers to free air around the  collapsed lung(s), inside of the thoracic cavity (chest cavity).  Another two possible  complications related to a similar event would include: Hemothorax and Chylothorax.   These are variations of the Pneumothorax, where instead of air around the collapsed  lung(s), you may have blood or chyle, respectively.       6. Spinal headaches: They may occur with any procedures in the area of the spine.       7. Persistent CSF (Cerebro-Spinal Fluid) leakage: This is a rare problem, but may occur  with prolonged intrathecal or epidural catheters either due to the formation of a fistulous  track or a dural tear.       8. Nerve damage: By working so close to the spinal cord, there is always a possibility of  nerve damage, which could be as serious as a permanent spinal cord injury with  paralysis.       9. Death:  Although rare, severe deadly allergic reactions known as Anaphylactic  reaction can occur to any of the medications used.      10. Worsening of the symptoms:  We can always make thing worse.  What are the chances  of something like this happening? Chances of any of this occuring are extremely low.  By statistics, you have more of a chance of getting killed in a motor vehicle accident: while driving to the hospital than any of the above occurring .  Nevertheless, you should be aware that they are possibilities.  In general, it is similar to taking a shower.  Everybody knows that you can slip, hit your head and get killed.  Does that mean that you should not shower again?  Nevertheless always keep in mind that statistics do not mean anything if you happen to be on the wrong side of them.  Even if a procedure has a 1 (one) in a  1,000,000 (million) chance of going wrong, it you happen to be that one..Also, keep in mind that by statistics, you have more of a chance of having something go wrong when taking medications.  Who should not have this procedure? If you are on a blood thinning medication (e.g. Coumadin, Plavix, see list of Blood Thinners), or if you have an active infection going on, you should not have the procedure.  If you are taking any blood thinners, please inform your physician.  How should I prepare for this procedure? Do not eat or drink anything at least six hours prior to the procedure. Bring a driver with you .  It cannot be a taxi. Come accompanied by an adult that can drive you back, and that is strong enough to help you if your legs get weak or numb from the local anesthetic. Take all of your medicines the morning of the procedure with just enough water to swallow them. If you have diabetes, make sure that you are scheduled to have your procedure done first thing in the morning, whenever possible. If you have diabetes, take only half of your insulin dose and notify our nurse that you have done so as soon as you arrive at the clinic. If you are diabetic, but only take blood sugar pills (oral hypoglycemic), then do not take them on the morning of your procedure.  You may take them after you have had the procedure. Do not take aspirin or any aspirin-containing medications, at least eleven (11) days prior to the procedure.  They may prolong bleeding. Wear loose fitting clothing that may be easy to take off and that you would not mind if it got stained with Betadine or blood. Do not wear any jewelry or perfume Remove any nail coloring.  It will interfere with some of our monitoring equipment.  NOTE: Remember that this is not meant to be interpreted as a complete list of all possible complications.  Unforeseen problems may occur.  BLOOD THINNERS The following drugs contain aspirin or other products,  which can cause increased bleeding during surgery and should not be taken for 2 weeks prior to and 1 week after surgery.  If you should need take something for relief of minor pain, you may take acetaminophen which is found in Tylenol,m Datril, Anacin-3 and Panadol. It is not blood thinner. The products listed below are.  Do not take any of the products listed below in addition to any listed on your instruction sheet.  A.P.C or A.P.C with Codeine Codeine Phosphate Capsules #3 Ibuprofen Ridaura  ABC compound Congesprin Imuran rimadil  Advil Cope Indocin Robaxisal  Alka-Seltzer Effervescent Pain Reliever and Antacid Coricidin or Coricidin-D  Indomethacin Rufen  Alka-Seltzer plus Cold Medicine Cosprin Ketoprofen S-A-C Tablets  Anacin Analgesic Tablets or Capsules Coumadin  Korlgesic Salflex  Anacin Extra Strength Analgesic tablets or capsules CP-2 Tablets Lanoril Salicylate  Anaprox Cuprimine Capsules Levenox Salocol  Anexsia-D Dalteparin Magan Salsalate  Anodynos Darvon compound Magnesium Salicylate Sine-off  Ansaid Dasin Capsules Magsal Sodium Salicylate  Anturane Depen Capsules Marnal Soma  APF Arthritis pain formula Dewitt's Pills Measurin Stanback  Argesic Dia-Gesic Meclofenamic Sulfinpyrazone  Arthritis Bayer Timed Release Aspirin Diclofenac  Meclomen Sulindac  Arthritis pain formula Anacin Dicumarol Medipren Supac  Analgesic (Safety coated) Arthralgen Diffunasal Mefanamic Suprofen  Arthritis Strength Bufferin Dihydrocodeine Mepro Compound Suprol  Arthropan liquid Dopirydamole Methcarbomol with Aspirin Synalgos  ASA tablets/Enseals Disalcid Micrainin Tagament  Ascriptin Doan's Midol Talwin  Ascriptin A/D Dolene Mobidin Tanderil  Ascriptin Extra Strength Dolobid Moblgesic Ticlid  Ascriptin with Codeine Doloprin or Doloprin with Codeine Momentum Tolectin  Asperbuf Duoprin Mono-gesic Trendar  Aspergum Duradyne Motrin or Motrin IB Triminicin  Aspirin plain, buffered or enteric coated  Durasal Myochrisine Trigesic  Aspirin Suppositories Easprin Nalfon Trillsate  Aspirin with Codeine Ecotrin Regular or Extra Strength Naprosyn Uracel  Atromid-S Efficin Naproxen Ursinus  Auranofin Capsules Elmiron Neocylate Vanquish  Axotal Emagrin Norgesic Verin  Azathioprine Empirin or Empirin with Codeine Normiflo Vitamin E  Azolid Emprazil Nuprin Voltaren   Bayer Aspirin plain, buffered or children's or timed BC Tablets or powders Encaprin Orgaran Warfarin Sodium  Buff-a-Comp Enoxaparin Orudis Zorpin  Buff-a-Comp with Codeine Equegesic Os-Cal-Gesic   Buffaprin Excedrin plain, buffered or Extra Strength Oxalid   Bufferin Arthritis Strength Feldene Oxphenbutazone   Bufferin plain or Extra Strength Feldene Capsules Oxycodone with Aspirin   Bufferin with Codeine Fenoprofen Fenoprofen Pabalate or Pabalate-SF   Buffets II Flogesic Panagesic   Buffinol plain or Extra Strength Florinal or Florinal with Codeine Panwarfarin   Buf-Tabs Flurbiprofen Penicillamine   Butalbital Compound Four-way cold tablets Penicillin   Butazolidin Fragmin Pepto-Bismol   Carbenicillin Geminisyn Percodan   Carna Arthritis Reliever Geopen Persantine   Carprofen Gold's salt Persistin   Chloramphenicol Goody's Phenylbutazone   Chloromycetin Haltrain Piroxlcam   Clmetidine heparin Plaquenil   Cllnoril Hyco-pap Ponstel   Clofibrate Hydroxy chloroquine Propoxyphen         Before stopping any of these medications, be sure to consult the physician who ordered them.  Some, such as Coumadin (Warfarin) are ordered to prevent or treat serious conditions such as deep thrombosis, pumonary embolisms, and other heart problems.  The amount of time that you may need off of the medication may also vary with the medication and the reason for which you were taking it.  If you are taking any of these medications, please make sure you notify your pain physician before you undergo any procedures.         Sodium Hyaluronate  intra-articular injection What is this medication? SODIUM HYALURONATE (SOE dee um hye al yoor ON ate) is used to treat pain in the knee due to osteoarthritis. This medicine may be used for other purposes; ask your health care provider or pharmacist if you have questions. This medicine may be used for other purposes; ask your health care provider or pharmacist if you have questions. COMMON BRAND NAME(S): Amvisc, DUROLANE, Euflexxa, GELSYN-3, Hyalgan, Hymovis, Monovisc, Orthovisc, Supartz, Supartz FX, SynoJoynt, Triluron, TriVisc, VISCO What should I tell my care team before I take this medication? They need to know if you have any of these conditions: bleeding disorders glaucoma infection in the knee joint skin conditions or sensitivity skin infection an unusual allergic reaction to sodium hyaluronate, other medicines, foods, dyes, or preservatives. Different brands of sodium  hyaluronate contain different allergens. Some may contain egg. Talk to your doctor about your allergies to make sure that you get the right product. pregnant or trying to get pregnant breast-feeding How should I use this medication? This medicine is for injection into the knee joint. It is given by a health care professional in a hospital or clinic setting. Talk to your pediatrician regarding the use of this medicine in children. Special care may be needed. Overdosage: If you think you have taken too much of this medicine contact a poison control center or emergency room at once. NOTE: This medicine is only for you. Do not share this medicine with others. Overdosage: If you think you have taken too much of this medicine contact a poison control center or emergency room at once. NOTE: This medicine is only for you. Do not share this medicine with others. What if I miss a dose? This does not apply. What may interact with this medication? Interactions are not expected. This list may not describe all possible interactions.  Give your health care provider a list of all the medicines, herbs, non-prescription drugs, or dietary supplements you use. Also tell them if you smoke, drink alcohol, or use illegal drugs. Some items may interact with your medicine. This list may not describe all possible interactions. Give your health care provider a list of all the medicines, herbs, non-prescription drugs, or dietary supplements you use. Also tell them if you smoke, drink alcohol, or use illegal drugs. Some items may interact with your medicine. What should I watch for while using this medication? Tell your doctor or healthcare professional if your symptoms do not start to get better or if they get worse. If receiving this medicine for osteoarthritis, limit your activity after you receive your injection. Avoid physical activity for 48 hours following your injection to keep your knee from swelling. Do not stand on your feet for more than 1 hour at a time during the first 48 hours following your injection. Ask your doctor or healthcare professional about when you can begin major physical activity again. What side effects may I notice from receiving this medication? Side effects that you should report to your doctor or health care professional as soon as possible: allergic reactions like skin rash, itching or hives, swelling of the face, lips, or tongue dizziness facial flushing pain, tingling, numbness in the hands or feet vision changes if received this medicine during eye surgery Side effects that usually do not require medical attention (report to your doctor or health care professional if they continue or are bothersome): back pain bruising at site where injected chills diarrhea fever headache joint pain joint stiffness joint swelling muscle cramps muscle pain nausea, vomiting pain, redness, or irritation at site where injected weak or tired This list may not describe all possible side effects. Call your doctor for  medical advice about side effects. You may report side effects to FDA at 1-800-FDA-1088. This list may not describe all possible side effects. Call your doctor for medical advice about side effects. You may report side effects to FDA at 1-800-FDA-1088. Where should I keep my medication? This drug is given in a hospital or clinic and will not be stored at home. NOTE: This sheet is a summary. It may not cover all possible information. If you have questions about this medicine, talk to your doctor, pharmacist, or health care provider.  2024 [employer]/Gold Standard (2015-01-24 00:00:00)

## 2023-08-05 NOTE — Progress Notes (Signed)
 Safety precautions to be maintained throughout the outpatient stay will include: orient to surroundings, keep bed in low position, maintain call bell within reach at all times, provide assistance with transfer out of bed and ambulation.

## 2023-08-11 ENCOUNTER — Ambulatory Visit
Attending: Student in an Organized Health Care Education/Training Program | Admitting: Student in an Organized Health Care Education/Training Program

## 2023-08-11 ENCOUNTER — Encounter: Payer: Self-pay | Admitting: Student in an Organized Health Care Education/Training Program

## 2023-08-11 DIAGNOSIS — M25562 Pain in left knee: Secondary | ICD-10-CM | POA: Diagnosis present

## 2023-08-11 DIAGNOSIS — M1712 Unilateral primary osteoarthritis, left knee: Secondary | ICD-10-CM | POA: Insufficient documentation

## 2023-08-11 DIAGNOSIS — G8929 Other chronic pain: Secondary | ICD-10-CM | POA: Diagnosis present

## 2023-08-11 MED ORDER — LIDOCAINE HCL (PF) 2 % IJ SOLN
INTRAMUSCULAR | Status: AC
  Filled 2023-08-11: qty 5

## 2023-08-11 MED ORDER — SODIUM HYALURONATE (VISCOSUP) 20 MG/2ML IX SOSY
2.0000 mL | PREFILLED_SYRINGE | Freq: Once | INTRA_ARTICULAR | Status: AC
  Administered 2023-08-11: 20 mg via INTRA_ARTICULAR

## 2023-08-11 MED ORDER — LIDOCAINE HCL 2 % IJ SOLN
20.0000 mL | Freq: Once | INTRAMUSCULAR | Status: AC
  Administered 2023-08-11: 100 mg

## 2023-08-11 MED ORDER — DICLOFENAC SODIUM 75 MG PO TBEC: 75.0000 mg | DELAYED_RELEASE_TABLET | Freq: Every day | ORAL | 1 refills | Status: AC | PRN

## 2023-08-11 NOTE — Progress Notes (Addendum)
 PROVIDER NOTE: Interpretation of information contained herein should be left to medically-trained personnel. Specific patient instructions are provided elsewhere under Patient Instructions section of medical record. This document was created in part using STT-dictation technology, any transcriptional errors that may result from this process are unintentional.  Patient: Sydney Torres Type: Established DOB: 1960/11/10 MRN: [id number] PCP: Bathe Fredy RAMAN, MD  Service: Procedure DOS: 08/11/2023 Setting: Ambulatory Location: Ambulatory outpatient facility Delivery: Face-to-face Provider: Wallie Sherry, MD Specialty: Interventional Pain Management Specialty designation: 09 Location: Outpatient facility Ref. Prov.: Sherry Wallie, MD       Interventional Therapy   Type:  Hyalgan Intra-articular Knee Injection #1  Laterality: Left (-LT) Level/approach: Medial Imaging guidance: None required ([id number]) Anesthesia: Local anesthesia (1-2% Lidocaine ) DOS: 08/11/2023  Performed by: Wallie Sherry, MD  Purpose: Diagnostic/Therapeutic Indications: Knee arthralgia associated to osteoarthritis of the knee 1. Chronic pain of left knee   2. Primary osteoarthritis of left knee    NAS-11 score:   Pre-procedure: 9 /10   Post-procedure: 3 /10     Pre-Procedure Preparation  Monitoring: As per clinic protocol.  Risk Assessment: Vitals:  AFP:Zdupfjuzi body mass index is 34.96 kg/m as calculated from the following:   Height as of 08/05/23: 5' 1 (1.549 m).   Weight as of 08/05/23: 185 lb (83.9 kg)., Rate:  , BP: , Resp: , Temp: , SpO2:   Allergies: She has no known allergies.  Precautions: No additional precautions required  Blood-thinner(s): None at this time  Coagulopathies: Reviewed. None identified.   Active Infection(s): Reviewed. None identified. Sydney Torres is afebrile   Location setting: Exam room Position: Sitting w/ knee bent 90 degrees Safety Precautions: Patient was assessed for positional  comfort and pressure points before starting the procedure. Prepping solution: DuraPrep (Iodine Povacrylex [0.7% available iodine] and Isopropyl Alcohol, 74% w/w) Prep Area: Entire knee region Approach: percutaneous, just above the tibial plateau, lateral to the infrapatellar tendon. Intended target: Intra-articular knee space Materials: Tray: Block Needle(s): Regular Qty: 1/side Length: 1.5-inch Gauge: 25G (x1) + 22G (x1)  2 cc of fluid drained before injection  Meds ordered this encounter  Medications   diclofenac  (VOLTAREN ) 75 MG EC tablet    Sig: Take 1 tablet (75 mg total) by mouth daily as needed.    Dispense:  30 tablet    Refill:  1   Sodium Hyaluronate (Viscosup) SOSY 20 mg    Do not substitute. Deliver to facility day before procedure.   lidocaine  (XYLOCAINE ) 2 % (with pres) injection 400 mg    No orders of the defined types were placed in this encounter.    Time-out:   I initiated and conducted the Time-out before starting the procedure, as per protocol. The patient was asked to participate by confirming the accuracy of the Time Out information. Verification of the correct person, site, and procedure were performed and confirmed by me, the nursing staff, and the patient. Time-out conducted as per Joint Commission's Universal Protocol (UP.01.01.01). Procedure checklist: Completed  H&P (Pre-op  Assessment)  Sydney Torres is a 62 y.o. (year old), female patient, seen today for interventional treatment. She  has a past surgical history that includes Hernia repair (2010). Sydney Torres has a current medication list which includes the following prescription(s): baclofen , enalapril , gabapentin , fusion plus, metformin , phentermine , rosuvastatin , solifenacin , venlafaxine  xr, vitamin d  (ergocalciferol ), and diclofenac , and the following Facility-Administered Medications: lidocaine  and sodium hyaluronate (viscosup). Her primarily concern today is the Knee Pain (Left ) and Back Pain (Lumbar  bilateral )  She has no known allergies.   Last encounter: My last encounter with her was on 08/05/2023. Pertinent problems: Sydney Torres does not have any pertinent problems on file. Pain Assessment: Severity of Chronic pain is reported as a 9 /10. Location: Knee Left/down into the calf occassionally. Onset: More than a month ago. Quality: Discomfort, Aching, Constant. Timing: Constant. Modifying factor(s): rest, topicals. Vitals:  vitals were not taken for this visit.   Reason for encounter: interventional pain management therapy due pain of at least four (4) weeks in duration, with failure to respond and/or inability to tolerate more conservative care.  Site Confirmation: Sydney Torres was asked to confirm the procedure and laterality before marking the site.  Consent: Before the procedure and under the influence of no sedative(s), amnesic(s), or anxiolytics, the patient was informed of the treatment options, risks and possible complications. To fulfill our ethical and legal obligations, as recommended by the American Medical Association's Code of Ethics, I have informed the patient of my clinical impression; the nature and purpose of the treatment or procedure; the risks, benefits, and possible complications of the intervention; the alternatives, including doing nothing; the risk(s) and benefit(s) of the alternative treatment(s) or procedure(s); and the risk(s) and benefit(s) of doing nothing. The patient was provided information about the general risks and possible complications associated with the procedure. These may include, but are not limited to: failure to achieve desired goals, infection, bleeding, organ or nerve damage, allergic reactions, paralysis, and death. In addition, the patient was informed of those risks and complications associated to Spine-related procedures, such as failure to decrease pain; infection (i.e.: Meningitis, epidural or intraspinal abscess); bleeding (i.e.: epidural  hematoma, subarachnoid hemorrhage, or any other type of intraspinal or peri-dural bleeding); organ or nerve damage (i.e.: Any type of peripheral nerve, nerve root, or spinal cord injury) with subsequent damage to sensory, motor, and/or autonomic systems, resulting in permanent pain, numbness, and/or weakness of one or several areas of the body; allergic reactions; (i.e.: anaphylactic reaction); and/or death. Furthermore, the patient was informed of those risks and complications associated with the medications. These include, but are not limited to: allergic reactions (i.e.: anaphylactic or anaphylactoid reaction(s)); adrenal axis suppression; blood sugar elevation that in diabetics may result in ketoacidosis or comma; water retention that in patients with history of congestive heart failure may result in shortness of breath, pulmonary edema, and decompensation with resultant heart failure; weight gain; swelling or edema; medication-induced neural toxicity; particulate matter embolism and blood vessel occlusion with resultant organ, and/or nervous system infarction; and/or aseptic necrosis of one or more joints. Finally, the patient was informed that Medicine is not an exact science; therefore, there is also the possibility of unforeseen or unpredictable risks and/or possible complications that may result in a catastrophic outcome. The patient indicated having understood very clearly. We have given the patient no guarantees and we have made no promises. Enough time was given to the patient to ask questions, all of which were answered to the patient's satisfaction. Ms. Hewes has indicated that she wanted to continue with the procedure. Attestation: I, the ordering provider, attest that I have discussed with the patient the benefits, risks, side-effects, alternatives, likelihood of achieving goals, and potential problems during recovery for the procedure that I have provided informed consent.  Date  Time: 08/11/2023  12:38 PM  Description of procedure  Start Time:   hrs  Local Anesthesia: Once the patient was positioned, prepped, and time-out was completed. The target area was identified located.  The skin was marked with an approved surgical skin marker. Once marked, the skin (epidermis, dermis, and hypodermis), and deeper tissues (fat, connective tissue and muscle) were infiltrated with a small amount of a short-acting local anesthetic, loaded on a 10cc syringe with a 25G, 1.5-in  Needle. An appropriate amount of time was allowed for local anesthetics to take effect before proceeding to the next step. Local Anesthetic: Lidocaine  1-2% The unused portion of the local anesthetic was discarded in the proper designated containers. Safety Precautions: Aspiration looking for blood return was conducted prior to all injections. At no point did I inject any substances, as a needle was being advanced. Before injecting, the patient was told to immediately notify me if she was experiencing any new onset of ringing in the ears, or metallic taste in the mouth. No attempts were made at seeking any paresthesias. Safe injection practices and needle disposal techniques used. Medications properly checked for expiration dates. SDV (single dose vial) medications used. After the completion of the procedure, all disposable equipment used was discarded in the proper designated medical waste containers.  Technical description: Protocol guidelines were followed. After positioning, the target area was identified and prepped in the usual manner. Skin & deeper tissues infiltrated with local anesthetic. Appropriate amount of time allowed to pass for local anesthetics to take effect. Proper needle placement secured. Once satisfactory needle placement was confirmed, I proceeded to inject the desired solution in slow, incremental fashion, intermittently assessing for discomfort or any signs of abnormal or undesired spread of substance. Once  completed, the needle was removed and disposed of, as per hospital protocols. The area was cleaned, making sure to leave some of the prepping solution back to take advantage of its long term bactericidal properties.  Aspiration:  Negative        There were no vitals filed for this visit.  End Time:   hrs  Imaging guidance  Imaging-assisted Technique: None required. Indication(s): N/A Exposure Time: N/A Contrast: None Fluoroscopic Guidance: N/A Ultrasound Guidance: N/A Interpretation: N/A  Post-op assessment  Post-procedure Vital Signs:  Pulse/HCG Rate:    Temp:   Resp:   BP:   SpO2:    EBL: None  Complications: No immediate post-treatment complications observed by team, or reported by patient.  Note: The patient tolerated the entire procedure well. A repeat set of vitals were taken after the procedure and the patient was kept under observation following institutional policy, for this type of procedure. Post-procedural neurological assessment was performed, showing return to baseline, prior to discharge. The patient was provided with post-procedure discharge instructions, including a section on how to identify potential problems. Should any problems arise concerning this procedure, the patient was given instructions to immediately contact us , at any time, without hesitation. In any case, we plan to contact the patient by telephone for a follow-up status report regarding this interventional procedure.  Comments:  No additional relevant information.  Plan of care Requested Prescriptions   Signed Prescriptions Disp Refills   diclofenac  (VOLTAREN ) 75 MG EC tablet 30 tablet 1    Sig: Take 1 tablet (75 mg total) by mouth daily as needed.     Medications administered: Lashay Lender had no medications administered during this visit.   Follow-up plan:   Return in about 3 weeks (around 09/01/2023), or PPE VV.     Recent Visits Date Type Provider Dept  08/05/23 Office Visit  Marcelino Nurse, MD [facility]  07/07/23 Procedure visit Marcelino Nurse, MD [facility]  06/22/23 Office Visit Marcelino Nurse, MD [facility]  Showing recent visits within past 90 days and meeting all other requirements Today's Visits Date Type Provider Dept  08/11/23 Procedure visit Marcelino Nurse, MD [facility]  Showing today's visits and meeting all other requirements Future Appointments Date Type Provider Dept  09/02/23 Appointment Marcelino Nurse, MD [facility]  Showing future appointments within next 90 days and meeting all other requirements   Disposition: Discharge home  Discharge (Date  Time): 08/11/2023; 1325 hrs.   Primary Care Physician: Fernand Fredy RAMAN, MD Location: [facility] Note by: Nurse Marcelino, MD Date: 08/11/2023; Time: 1:43 PM  DISCLAIMER: Medicine is not an Visual merchandiser. It has no guarantees or warranties. The decision to proceed with this intervention was based on the information collected from the patient. Conclusions were drawn from the patient's questionnaire, interview, and examination. Because information was provided in large part by the patient, it cannot be guaranteed that it has not been purposely or unconsciously manipulated or altered. Every effort has been made to obtain as much accurate, relevant, available data as possible. Always take into account that the treatment will also be dependent on availability of resources and existing treatment guidelines, considered by other Pain Management Specialists as being common knowledge and practice, at the time of the intervention. It is also important to point out that variation in procedural techniques and pharmacological choices are the acceptable norm. For Medico-Legal review purposes, the indications, contraindications, technique, and results of the these procedures should only be evaluated, judged and interpreted by a Board-Certified  Interventional Pain Specialist with extensive familiarity and expertise in the same exact procedure and technique.

## 2023-08-11 NOTE — Patient Instructions (Addendum)
 You Tube:  stretching exercises for lower back.  ______________________________________________________________________    General Risks and Possible Complications  Patient Responsibilities: It is important that you read this as it is part of your informed consent. It is our duty to inform you of the risks and possible complications associated with treatments offered to you. It is your responsibility as a patient to read this and to ask questions about anything that is not clear or that you believe was not covered in this document.  Patient's Rights: You have the right to refuse treatment. You also have the right to change your mind, even after initially having agreed to have the treatment done. However, under this last option, if you wait until the last second to change your mind, you may be charged for the materials used up to that point.  Introduction: Medicine is not an Visual merchandiser. Everything in Medicine, including the lack of treatment(s), carries the potential for danger, harm, or loss (which is by definition: Risk). In Medicine, a complication is a secondary problem, condition, or disease that can aggravate an already existing one. All treatments carry the risk of possible complications. The fact that a side effects or complications occurs, does not imply that the treatment was conducted incorrectly. It must be clearly understood that these can happen even when everything is done following the highest safety standards.  No treatment: You can choose not to proceed with the proposed treatment alternative. The "PRO(s)" would include: avoiding the risk of complications associated with the therapy. The "CON(s)" would include: not getting any of the treatment benefits. These benefits fall under one of three categories: diagnostic; therapeutic; and/or palliative. Diagnostic benefits include: getting information which can ultimately lead to improvement of the disease or symptom(s). Therapeutic benefits  are those associated with the successful treatment of the disease. Finally, palliative benefits are those related to the decrease of the primary symptoms, without necessarily curing the condition (example: decreasing the pain from a flare-up of a chronic condition, such as incurable terminal cancer).  General Risks and Complications: These are associated to most interventional treatments. They can occur alone, or in combination. They fall under one of the following six (6) categories: no benefit or worsening of symptoms; bleeding; infection; nerve damage; allergic reactions; and/or death. No benefits or worsening of symptoms: In Medicine there are no guarantees, only probabilities. No healthcare provider can ever guarantee that a medical treatment will work, they can only state the probability that it may. Furthermore, there is always the possibility that the condition may worsen, either directly, or indirectly, as a consequence of the treatment. Bleeding: This is more common if the patient is taking a blood thinner, either prescription or over the counter (example: Goody Powders, Fish oil, Aspirin, Garlic, etc.), or if suffering a condition associated with impaired coagulation (example: Hemophilia, cirrhosis of the liver, low platelet counts, etc.). However, even if you do not have one on these, it can still happen. If you have any of these conditions, or take one of these drugs, make sure to notify your treating physician. Infection: This is more common in patients with a compromised immune system, either due to disease (example: diabetes, cancer, human immunodeficiency virus [HIV], etc.), or due to medications or treatments (example: therapies used to treat cancer and rheumatological diseases). However, even if you do not have one on these, it can still happen. If you have any of these conditions, or take one of these drugs, make sure to notify your treating physician. Nerve  Damage: This is more common when  the treatment is an invasive one, but it can also happen with the use of medications, such as those used in the treatment of cancer. The damage can occur to small secondary nerves, or to large primary ones, such as those in the spinal cord and brain. This damage may be temporary or permanent and it may lead to impairments that can range from temporary numbness to permanent paralysis and/or brain death. Allergic Reactions: Any time a substance or material comes in contact with our body, there is the possibility of an allergic reaction. These can range from a mild skin rash (contact dermatitis) to a severe systemic reaction (anaphylactic reaction), which can result in death. Death: In general, any medical intervention can result in death, most of the time due to an unforeseen complication. ______________________________________________________________________

## 2023-08-12 ENCOUNTER — Telehealth: Payer: Self-pay

## 2023-08-12 NOTE — Telephone Encounter (Signed)
 Post procedure follow up.  Patient states she is doing good.

## 2023-08-18 ENCOUNTER — Other Ambulatory Visit: Payer: Self-pay | Admitting: Internal Medicine

## 2023-08-18 DIAGNOSIS — I10 Essential (primary) hypertension: Secondary | ICD-10-CM

## 2023-08-31 ENCOUNTER — Other Ambulatory Visit: Payer: Self-pay | Admitting: Internal Medicine

## 2023-08-31 DIAGNOSIS — E782 Mixed hyperlipidemia: Secondary | ICD-10-CM

## 2023-09-02 ENCOUNTER — Encounter: Payer: Self-pay | Admitting: Student in an Organized Health Care Education/Training Program

## 2023-09-02 ENCOUNTER — Ambulatory Visit
Attending: Student in an Organized Health Care Education/Training Program | Admitting: Student in an Organized Health Care Education/Training Program

## 2023-09-02 DIAGNOSIS — M25562 Pain in left knee: Secondary | ICD-10-CM | POA: Diagnosis not present

## 2023-09-02 DIAGNOSIS — G894 Chronic pain syndrome: Secondary | ICD-10-CM | POA: Diagnosis not present

## 2023-09-02 DIAGNOSIS — G8929 Other chronic pain: Secondary | ICD-10-CM

## 2023-09-02 DIAGNOSIS — M1712 Unilateral primary osteoarthritis, left knee: Secondary | ICD-10-CM

## 2023-09-02 NOTE — Progress Notes (Signed)
 PROVIDER NOTE: Interpretation of information contained herein should be left to medically-trained personnel. Specific patient instructions are provided elsewhere under Patient Instructions section of medical record. This document was created in part using AI and STT-dictation technology, any transcriptional errors that may result from this process are unintentional.  Patient: Sydney Torres  Service: E/M   PCP: Torres Fredy RAMAN, MD  DOB: 06/28/1960  DOS: 09/02/2023  Provider: Wallie Sherry, MD  MRN: [id number]  Delivery: Virtual Visit  Specialty: Interventional Pain Management  Type: Established Patient  Setting: Ambulatory outpatient facility  Specialty designation: 09  Referring Prov.: Torres Fredy RAMAN, MD  Location: Remote location       Virtual Encounter - Pain Management PROVIDER NOTE: Information contained herein reflects review and annotations entered in association with encounter. Interpretation of such information and data should be left to medically-trained personnel. Information provided to patient can be located elsewhere in the medical record under Patient Instructions. Document created using STT-dictation technology, any transcriptional errors that may result from process are unintentional.    Contact & Pharmacy Preferred: [phone] Home: [phone] (home) Mobile: [phone] (mobile) E-mail: [facility] [address] ([place]), [place] [address] ([place]) [place] [address] Phone: [phone] Fax: [phone]   Pre-screening  Ms. Torres offered in-person vs virtual encounter. She indicated preferring virtual for this encounter.   Reason COVID-19*  Social distancing based on CDC and AMA recommendations.   I contacted Gracilyn Gunia on 09/02/2023 via telephone.      I clearly identified myself as Wallie Sherry, MD. I verified that I was speaking with the correct person using two identifiers (Name: Jaselyn Nahm, and  date of birth: Dec 06, 1960).  Consent I sought verbal advanced consent from Sydney Torres for virtual visit interactions. I informed Ms. Lukin of possible security and privacy concerns, risks, and limitations associated with providing not-in-person medical evaluation and management services. I also informed Ms. Ruppert of the availability of in-person appointments. Finally, I informed her that there would be a charge for the virtual visit and that she could be  personally, fully or partially, financially responsible for it. Ms. Mapes expressed understanding and agreed to proceed.   Historic Elements   Ms. Kailah Pennel is a 63 y.o. year old, female patient evaluated today after our last contact on 08/11/2023. Ms. Azam  has a past medical history of Arthritis, Depression, Diabetes (HCC), Hyperlipemia, Hypertension, and Urinary frequency. She also  has a past surgical history that includes Hernia repair (2010). Ms. Massimo has a current medication list which includes the following prescription(s): baclofen , diclofenac , enalapril , gabapentin , fusion plus, metformin , phentermine , rosuvastatin , solifenacin , venlafaxine  xr, and vitamin d  (ergocalciferol ). She  reports that she has never smoked. She has never used smokeless tobacco. She reports that she does not drink alcohol and does not use drugs. Ms. Detter has no known allergies.  BMI: Estimated body mass index is 34.96 kg/m as calculated from the following:   Height as of 08/05/23: 5' 1 (1.549 m).   Weight as of 08/05/23: 185 lb (83.9 kg). Last encounter: 08/05/2023. Last procedure: 08/11/2023.  HPI  Today, she is being contacted for   Post-Procedure Evaluation   Type:  Hyalgan Intra-articular Knee Injection #1  Laterality: Left (-LT) Level/approach: Medial Imaging guidance: None required ([id number]) Anesthesia: Local anesthesia (1-2% Lidocaine ) DOS: 08/11/2023  Performed by: Wallie Sherry, MD  Purpose: Diagnostic/Therapeutic Indications: Knee arthralgia  associated to osteoarthritis of the knee 1. Chronic pain of left knee  2. Primary osteoarthritis of left knee    NAS-11 score:   Pre-procedure: 9 /10   Post-procedure: 3 /10     Effectiveness:  Initial hour after procedure: 90 %  Subsequent 4-6 hours post-procedure: 90 %  Analgesia past initial 6 hours: 50 %  Ongoing improvement:  Analgesic:  50% Function: Somewhat improved   Laboratory Chemistry Profile   Renal Lab Results  Component Value Date   BUN 19 06/25/2023   CREATININE 0.73 06/25/2023   BCR 26 06/25/2023    Hepatic Lab Results  Component Value Date   AST 24 06/25/2023   ALT 23 06/25/2023   ALBUMIN 4.1 06/25/2023   ALKPHOS 83 06/25/2023    Electrolytes Lab Results  Component Value Date   NA 139 06/25/2023   K 4.6 06/25/2023   CL 103 06/25/2023   CALCIUM  9.0 06/25/2023    Bone Lab Results  Component Value Date   VD25OH 50.7 06/25/2023    Inflammation (CRP: Acute Phase) (ESR: Chronic Phase) No results found for: CRP, ESRSEDRATE, LATICACIDVEN       Note: Above Lab results reviewed.  Imaging  DG Knee Complete 4 Views Left CLINICAL DATA:  Left knee pain/arthralgia  EXAM: LEFT KNEE - COMPLETE 4+ VIEW  COMPARISON:  None Available.  FINDINGS: No evidence of fracture, dislocation, or joint effusion. There is tricompartmental joint space narrowing, sclerosis and osteophytes consistent with degenerative joint disease. No evidence of effusion.  IMPRESSION: Degenerative changes.  Electronically Signed   By: Fonda Field M.D.   On: 06/30/2023 21:10  Assessment  The primary encounter diagnosis was Chronic pain of left knee. Diagnoses of Primary osteoarthritis of left knee and Chronic pain syndrome were also pertinent to this visit.  Plan of Care  Patient getting a positive response from her left intra-articular knee Hyalgan injection.  She states that walking is more comfortable and she has less pain.  Rates pain relief currently at  50%.  States that she does have to walk slowly at times when her pain increases.  We discussed Haugan injection #2.  Risks and benefits reviewed and patient would like to proceed.  Orders:  Orders Placed This Encounter  Procedures   KNEE INJECTION    Indications: Knee arthralgia (pain) due to osteoarthritis (OA) Imaging: None ([id number]) Position: Sitting Equipment/Materials: Block tray  1.5, 25-G (one per side)  Local anesthetic  Hyalgan (one per side)    Scheduling Instructions:     Procedure: Knee injection Hyalgan (Hyaluronan/Hyaluronic acid)     Treatment No.2:            Level: Intra-articular     Laterality: Bilateral Knee     Sedation: Patient's choice.    Where will this procedure be performed?:   [facility]   Follow-up plan:   Return in about 18 days (around 09/20/2023) for Left knee hyalgan injection #2.      Recent Visits Date Type Provider Dept  08/11/23 Procedure visit Marcelino Nurse, MD [facility]  08/05/23 Office Visit Marcelino Nurse, MD [facility]  07/07/23 Procedure visit Marcelino Nurse, MD [facility]  06/22/23 Office Visit Marcelino Nurse, MD [facility]  Showing recent visits within past 90 days and meeting all other requirements Today's Visits Date Type Provider Dept  09/02/23 Office Visit Marcelino Nurse, MD [facility]  Showing today's visits and meeting all other requirements Future Appointments No visits were found meeting these conditions. Showing future appointments within next 90 days and meeting all  other requirements  I discussed the assessment and treatment plan with the patient. The patient was provided an opportunity to ask questions and all were answered. The patient agreed with the plan and demonstrated an understanding of the instructions.  Patient advised to call back or seek an in-person evaluation if the symptoms or condition worsens.  Duration of encounter: .  Note  by: Wallie Sherry, MD Date: 09/02/2023; Time: 3:53 PM

## 2023-09-15 ENCOUNTER — Other Ambulatory Visit: Payer: Self-pay | Admitting: Cardiology

## 2023-09-15 DIAGNOSIS — I10 Essential (primary) hypertension: Secondary | ICD-10-CM

## 2023-09-20 ENCOUNTER — Encounter: Payer: Self-pay | Admitting: Student in an Organized Health Care Education/Training Program

## 2023-09-20 ENCOUNTER — Ambulatory Visit
Attending: Student in an Organized Health Care Education/Training Program | Admitting: Student in an Organized Health Care Education/Training Program

## 2023-09-20 VITALS — BP 129/81 | HR 91 | Temp 98.2°F | Resp 16 | Ht <= 58 in | Wt 185.0 lb

## 2023-09-20 DIAGNOSIS — G894 Chronic pain syndrome: Secondary | ICD-10-CM | POA: Insufficient documentation

## 2023-09-20 DIAGNOSIS — M25562 Pain in left knee: Secondary | ICD-10-CM | POA: Diagnosis present

## 2023-09-20 DIAGNOSIS — M1712 Unilateral primary osteoarthritis, left knee: Secondary | ICD-10-CM | POA: Insufficient documentation

## 2023-09-20 DIAGNOSIS — G8929 Other chronic pain: Secondary | ICD-10-CM | POA: Diagnosis present

## 2023-09-20 MED ORDER — LIDOCAINE HCL (PF) 2 % IJ SOLN
INTRAMUSCULAR | Status: AC
  Filled 2023-09-20: qty 5

## 2023-09-20 MED ORDER — SODIUM HYALURONATE (VISCOSUP) 20 MG/2ML IX SOSY
2.0000 mL | PREFILLED_SYRINGE | Freq: Once | INTRA_ARTICULAR | Status: AC
  Administered 2023-09-20: 20 mg via INTRA_ARTICULAR

## 2023-09-20 MED ORDER — LIDOCAINE HCL 2 % IJ SOLN
20.0000 mL | Freq: Once | INTRAMUSCULAR | Status: AC
  Administered 2023-09-20: 100 mg

## 2023-09-20 NOTE — Progress Notes (Signed)
 PROVIDER NOTE: Interpretation of information contained herein should be left to medically-trained personnel. Specific patient instructions are provided elsewhere under Patient Instructions section of medical record. This document was created in part using STT-dictation technology, any transcriptional errors that may result from this process are unintentional.  Patient: Sydney Torres Type: Established DOB: 1960-04-12 MRN: [id number] PCP: Bathe Fredy RAMAN, MD  Service: Procedure DOS: 09/20/2023 Setting: Ambulatory Location: Ambulatory outpatient facility Delivery: Face-to-face Provider: Wallie Sherry, MD Specialty: Interventional Pain Management Specialty designation: 09 Location: Outpatient facility Ref. Prov.: Bathe Fredy RAMAN, MD       Interventional Therapy   Type:  Hyalgan Intra-articular Knee Injection #2  Laterality: Left (-LT) Level/approach: Medial Imaging guidance: None required ([id number]) Anesthesia: Local anesthesia (1-2% Lidocaine ) DOS: 09/20/2023  Performed by: Wallie Sherry, MD  Purpose: Diagnostic/Therapeutic Indications: Knee arthralgia associated to osteoarthritis of the knee 1. Chronic pain of left knee   2. Primary osteoarthritis of left knee   3. Chronic pain syndrome     NAS-11 score:   Pre-procedure: 5 /10   Post-procedure: 5 /10     Pre-Procedure Preparation  Monitoring: As per clinic protocol.  Risk Assessment: Vitals:  AFP:Zdupfjuzi body mass index is 41.48 kg/m as calculated from the following:   Height as of this encounter: 4' 8 (1.422 m).   Weight as of this encounter: 185 lb (83.9 kg)., Rate:91 , BP:129/81, Resp:16, Temp:98.2 F (36.8 C), SpO2:99 %  Allergies: She has no known allergies.  Precautions: No additional precautions required  Blood-thinner(s): None at this time  Coagulopathies: Reviewed. None identified.   Active Infection(s): Reviewed. None identified. Sydney Torres is afebrile   Location setting: Exam room Position: Sitting w/ knee bent  90 degrees Safety Precautions: Patient was assessed for positional comfort and pressure points before starting the procedure. Prepping solution: DuraPrep (Iodine Povacrylex [0.7% available iodine] and Isopropyl Alcohol, 74% w/w) Prep Area: Entire knee region Approach: percutaneous, just above the tibial plateau, lateral to the infrapatellar tendon. Intended target: Intra-articular knee space Materials: Tray: Block Needle(s): Regular Qty: 1/side Length: 1.5-inch Gauge: 25G (x1) + 22G (x1)  2 cc of fluid drained before injection  Meds ordered this encounter  Medications   lidocaine  (XYLOCAINE ) 2 % (with pres) injection 400 mg   Sodium Hyaluronate (Viscosup) SOSY 20 mg    Do not substitute. Deliver to facility day before procedure.    No orders of the defined types were placed in this encounter.    Time-out: 1401 I initiated and conducted the Time-out before starting the procedure, as per protocol. The patient was asked to participate by confirming the accuracy of the Time Out information. Verification of the correct person, site, and procedure were performed and confirmed by me, the nursing staff, and the patient. Time-out conducted as per Joint Commission's Universal Protocol (UP.01.01.01). Procedure checklist: Completed  H&P (Pre-op  Assessment)  Sydney Torres is a 63 y.o. (year old), female patient, seen today for interventional treatment. She  has a past surgical history that includes Hernia repair (2010). Sydney Torres has a current medication list which includes the following prescription(s): baclofen , diclofenac , enalapril , gabapentin , fusion plus, metformin , phentermine , rosuvastatin , venlafaxine  xr, and vitamin d  (ergocalciferol ), and the following Facility-Administered Medications: lidocaine  and sodium hyaluronate (viscosup). Her primarily concern today is the Knee Pain (left)  She has no known allergies.   Last encounter: My last encounter with her was on 09/02/2023. Pertinent  problems: Sydney Torres does not have any pertinent problems on file. Pain Assessment: Severity of Chronic pain is  reported as a 5 /10. Location: Knee Left/down to left calf at times. Onset: More than a month ago. Quality: Aching, Constant. Timing: Constant. Modifying factor(s): rest. Vitals:  height is 4' 8 (1.422 m) and weight is 185 lb (83.9 kg). Her temperature is 98.2 F (36.8 C). Her blood pressure is 129/81 and her pulse is 91. Her respiration is 16 and oxygen saturation is 99%.   Reason for encounter: interventional pain management therapy due pain of at least four (4) weeks in duration, with failure to respond and/or inability to tolerate more conservative care.  Site Confirmation: Sydney Torres was asked to confirm the procedure and laterality before marking the site.  Consent: Before the procedure and under the influence of no sedative(s), amnesic(s), or anxiolytics, the patient was informed of the treatment options, risks and possible complications. To fulfill our ethical and legal obligations, as recommended by the American Medical Association's Code of Ethics, I have informed the patient of my clinical impression; the nature and purpose of the treatment or procedure; the risks, benefits, and possible complications of the intervention; the alternatives, including doing nothing; the risk(s) and benefit(s) of the alternative treatment(s) or procedure(s); and the risk(s) and benefit(s) of doing nothing. The patient was provided information about the general risks and possible complications associated with the procedure. These may include, but are not limited to: failure to achieve desired goals, infection, bleeding, organ or nerve damage, allergic reactions, paralysis, and death. In addition, the patient was informed of those risks and complications associated to Spine-related procedures, such as failure to decrease pain; infection (i.e.: Meningitis, epidural or intraspinal abscess); bleeding (i.e.:  epidural hematoma, subarachnoid hemorrhage, or any other type of intraspinal or peri-dural bleeding); organ or nerve damage (i.e.: Any type of peripheral nerve, nerve root, or spinal cord injury) with subsequent damage to sensory, motor, and/or autonomic systems, resulting in permanent pain, numbness, and/or weakness of one or several areas of the body; allergic reactions; (i.e.: anaphylactic reaction); and/or death. Furthermore, the patient was informed of those risks and complications associated with the medications. These include, but are not limited to: allergic reactions (i.e.: anaphylactic or anaphylactoid reaction(s)); adrenal axis suppression; blood sugar elevation that in diabetics may result in ketoacidosis or comma; water retention that in patients with history of congestive heart failure may result in shortness of breath, pulmonary edema, and decompensation with resultant heart failure; weight gain; swelling or edema; medication-induced neural toxicity; particulate matter embolism and blood vessel occlusion with resultant organ, and/or nervous system infarction; and/or aseptic necrosis of one or more joints. Finally, the patient was informed that Medicine is not an exact science; therefore, there is also the possibility of unforeseen or unpredictable risks and/or possible complications that may result in a catastrophic outcome. The patient indicated having understood very clearly. We have given the patient no guarantees and we have made no promises. Enough time was given to the patient to ask questions, all of which were answered to the patient's satisfaction. Ms. Kenney has indicated that she wanted to continue with the procedure. Attestation: I, the ordering provider, attest that I have discussed with the patient the benefits, risks, side-effects, alternatives, likelihood of achieving goals, and potential problems during recovery for the procedure that I have provided informed consent.  Date  Time:  09/20/2023  1:29 PM  Description of procedure  Start Time: 1401 hrs  Local Anesthesia: Once the patient was positioned, prepped, and time-out was completed. The target area was identified located. The skin was marked with  an approved surgical skin marker. Once marked, the skin (epidermis, dermis, and hypodermis), and deeper tissues (fat, connective tissue and muscle) were infiltrated with a small amount of a short-acting local anesthetic, loaded on a 10cc syringe with a 25G, 1.5-in  Needle. An appropriate amount of time was allowed for local anesthetics to take effect before proceeding to the next step. Local Anesthetic: Lidocaine  1-2% The unused portion of the local anesthetic was discarded in the proper designated containers. Safety Precautions: Aspiration looking for blood return was conducted prior to all injections. At no point did I inject any substances, as a needle was being advanced. Before injecting, the patient was told to immediately notify me if she was experiencing any new onset of ringing in the ears, or metallic taste in the mouth. No attempts were made at seeking any paresthesias. Safe injection practices and needle disposal techniques used. Medications properly checked for expiration dates. SDV (single dose vial) medications used. After the completion of the procedure, all disposable equipment used was discarded in the proper designated medical waste containers.  Technical description: Protocol guidelines were followed. After positioning, the target area was identified and prepped in the usual manner. Skin & deeper tissues infiltrated with local anesthetic. Appropriate amount of time allowed to pass for local anesthetics to take effect. Proper needle placement secured. Once satisfactory needle placement was confirmed, I proceeded to inject the desired solution in slow, incremental fashion, intermittently assessing for discomfort or any signs of abnormal or undesired spread of substance.  Once completed, the needle was removed and disposed of, as per hospital protocols. The area was cleaned, making sure to leave some of the prepping solution back to take advantage of its long term bactericidal properties.  Aspiration:  Negative        Vitals:   09/20/23 1346  BP: 129/81  Pulse: 91  Resp: 16  Temp: 98.2 F (36.8 C)  SpO2: 99%  Weight: 185 lb (83.9 kg)  Height: 4' 8 (1.422 m)    End Time: 1403 hrs  Imaging guidance  Imaging-assisted Technique: None required. Indication(s): N/A Exposure Time: N/A Contrast: None Fluoroscopic Guidance: N/A Ultrasound Guidance: N/A Interpretation: N/A  Post-op assessment  Post-procedure Vital Signs:  Pulse/HCG Rate: 91  Temp: 98.2 F (36.8 C) Resp: 16 BP: 129/81 SpO2: 99 %  EBL: None  Complications: No immediate post-treatment complications observed by team, or reported by patient.  Note: The patient tolerated the entire procedure well. A repeat set of vitals were taken after the procedure and the patient was kept under observation following institutional policy, for this type of procedure. Post-procedural neurological assessment was performed, showing return to baseline, prior to discharge. The patient was provided with post-procedure discharge instructions, including a section on how to identify potential problems. Should any problems arise concerning this procedure, the patient was given instructions to immediately contact us , at any time, without hesitation. In any case, we plan to contact the patient by telephone for a follow-up status report regarding this interventional procedure.  Comments:  No additional relevant information.  Plan of care Requested Prescriptions    No prescriptions requested or ordered in this encounter     Medications administered: Karne Stigger had no medications administered during this visit.   Follow-up plan:   Return in about 4 weeks (around 10/18/2023) for PPE, VV.     Recent  Visits Date Type Provider Dept  09/02/23 Office Visit Marcelino Nurse, MD [facility]  08/11/23 Procedure visit Marcelino Nurse, MD [facility]  08/05/23 Office Visit Marcelino Nurse, MD [facility]  07/07/23 Procedure visit Marcelino Nurse, MD [facility]  06/22/23 Office Visit Marcelino Nurse, MD [facility]  Showing recent visits within past 90 days and meeting all other requirements Today's Visits Date Type Provider Dept  09/20/23 Procedure visit Marcelino Nurse, MD [facility]  Showing today's visits and meeting all other requirements Future Appointments No visits were found meeting these conditions. Showing future appointments within next 90 days and meeting all other requirements   Disposition: Discharge home  Discharge (Date  Time): 09/20/2023; 1410 hrs.   Primary Care Physician: Fernand Fredy RAMAN, MD Location: [facility] Note by: Nurse Marcelino, MD Date: 09/20/2023; Time: 2:05 PM  DISCLAIMER: Medicine is not an exact science. It has no guarantees or warranties. The decision to proceed with this intervention was based on the information collected from the patient. Conclusions were drawn from the patient's questionnaire, interview, and examination. Because information was provided in large part by the patient, it cannot be guaranteed that it has not been purposely or unconsciously manipulated or altered. Every effort has been made to obtain as much accurate, relevant, available data as possible. Always take into account that the treatment will also be dependent on availability of resources and existing treatment guidelines, considered by other Pain Management Specialists as being common knowledge and practice, at the time of the intervention. It is also important to point out that variation in procedural techniques and pharmacological choices are the acceptable norm. For Medico-Legal review purposes, the  indications, contraindications, technique, and results of the these procedures should only be evaluated, judged and interpreted by a Board-Certified Interventional Pain Specialist with extensive familiarity and expertise in the same exact procedure and technique.

## 2023-09-20 NOTE — Progress Notes (Signed)
 Safety precautions to be maintained throughout the outpatient stay will include: orient to surroundings, keep bed in low position, maintain call bell within reach at all times, provide assistance with transfer out of bed and ambulation.

## 2023-09-21 ENCOUNTER — Telehealth: Payer: Self-pay | Admitting: *Deleted

## 2023-09-21 NOTE — Telephone Encounter (Signed)
 Post procedure call;  no questions or concerns, knee is feeling better.

## 2023-10-07 ENCOUNTER — Ambulatory Visit: Admitting: Internal Medicine

## 2023-10-14 ENCOUNTER — Other Ambulatory Visit: Payer: Self-pay | Admitting: Internal Medicine

## 2023-10-14 DIAGNOSIS — I10 Essential (primary) hypertension: Secondary | ICD-10-CM

## 2023-10-19 ENCOUNTER — Other Ambulatory Visit: Payer: Self-pay | Admitting: Internal Medicine

## 2023-10-19 ENCOUNTER — Ambulatory Visit
Attending: Student in an Organized Health Care Education/Training Program | Admitting: Student in an Organized Health Care Education/Training Program

## 2023-10-19 ENCOUNTER — Other Ambulatory Visit: Payer: Self-pay

## 2023-10-19 ENCOUNTER — Telehealth: Payer: Self-pay

## 2023-10-19 DIAGNOSIS — E66811 Obesity, class 1: Secondary | ICD-10-CM

## 2023-10-19 DIAGNOSIS — M25562 Pain in left knee: Secondary | ICD-10-CM | POA: Diagnosis not present

## 2023-10-19 DIAGNOSIS — G894 Chronic pain syndrome: Secondary | ICD-10-CM | POA: Diagnosis not present

## 2023-10-19 DIAGNOSIS — M1712 Unilateral primary osteoarthritis, left knee: Secondary | ICD-10-CM | POA: Diagnosis not present

## 2023-10-19 DIAGNOSIS — G8929 Other chronic pain: Secondary | ICD-10-CM

## 2023-10-19 MED ORDER — PHENTERMINE HCL 37.5 MG PO TABS: 37.5000 mg | ORAL_TABLET | Freq: Every day | ORAL | 3 refills | Status: AC

## 2023-10-19 NOTE — Patient Instructions (Signed)

## 2023-10-19 NOTE — Progress Notes (Signed)
 PROVIDER NOTE: Interpretation of information contained herein should be left to medically-trained personnel. Specific patient instructions are provided elsewhere under Patient Instructions section of medical record. This document was created in part using AI and STT-dictation technology, any transcriptional errors that may result from this process are unintentional.  Patient: Sydney Torres  Service: E/M   PCP: Torres Fredy RAMAN, MD  DOB: [birth year]  DOS: 10/19/2023  Provider: Wallie Sherry, MD  MRN: [id number]  Delivery: Virtual Visit  Specialty: Interventional Pain Management  Type: Established Patient  Setting: Ambulatory outpatient facility  Specialty designation: 09  Referring Prov.: Torres Fredy RAMAN, MD  Location: Remote location       Virtual Encounter - Pain Management PROVIDER NOTE: Information contained herein reflects review and annotations entered in association with encounter. Interpretation of such information and data should be left to medically-trained personnel. Information provided to patient can be located elsewhere in the medical record under Patient Instructions. Document created using STT-dictation technology, any transcriptional errors that may result from process are unintentional.    Contact & Pharmacy Preferred: [phone] Home: [phone] (home) Mobile: [phone] (mobile) E-mail: [facility] [address] ([address]), [place] - [address] ([address]) [place] [address] Phone: [phone] Fax: [phone]   Pre-screening  Ms. Torres offered in-person vs virtual encounter. She indicated preferring virtual for this encounter.   Reason COVID-19*  Social distancing based on CDC and AMA recommendations.   I contacted Azarya Oconnell on 10/19/2023 via telephone.      I clearly identified myself as Wallie Sherry, MD. I verified that I was speaking with the correct person using two identifiers (Name: Eriel Dunckel, and  date of birth: 1960/10/22).  Consent I sought verbal advanced consent from Sydney Torres for virtual visit interactions. I informed Ms. Mikula of possible security and privacy concerns, risks, and limitations associated with providing not-in-person medical evaluation and management services. I also informed Ms. Howerton of the availability of in-person appointments. Finally, I informed her that there would be a charge for the virtual visit and that she could be  personally, fully or partially, financially responsible for it. Ms. Covell expressed understanding and agreed to proceed.   Historic Elements   Ms. Yen Wandell is a 63 y.o. year old, female patient evaluated today after our last contact on 09/20/2023. Ms. Bribiesca  has a past medical history of Arthritis, Depression, Diabetes (HCC), Hyperlipemia, Hypertension, and Urinary frequency. She also  has a past surgical history that includes Hernia repair (2010). Ms. Creegan has a current medication list which includes the following prescription(s): baclofen , enalapril , gabapentin , fusion plus, metformin , rosuvastatin , venlafaxine  xr, vitamin d  (ergocalciferol ), and phentermine . She  reports that she has never smoked. She has never used smokeless tobacco. She reports that she does not drink alcohol and does not use drugs. Ms. Louissaint has no known allergies.  BMI: Estimated body mass index is 41.48 kg/m as calculated from the following:   Height as of 09/20/23: 4' 8 (1.422 m).   Weight as of 09/20/23: 185 lb (83.9 kg). Last encounter: 09/02/2023. Last procedure: 09/20/2023.  HPI  Today, she is being contacted for   Post-Procedure Evaluation   Type:  Hyalgan Intra-articular Knee Injection #2  Laterality: Left (-LT) Level/approach: Medial Imaging guidance: None required ([id number]) Anesthesia: Local anesthesia (1-2% Lidocaine ) DOS: 09/20/2023  Performed by: Wallie Sherry, MD  Purpose: Diagnostic/Therapeutic Indications: Knee arthralgia associated to osteoarthritis  of the knee 1. Chronic pain of left knee   2. Primary  osteoarthritis of left knee   3. Chronic pain syndrome     NAS-11 score:   Pre-procedure: 5 /10   Post-procedure: 5 /10     Effectiveness:  Initial hour after procedure: 50 %  Subsequent 4-6 hours post-procedure: 50 %  Analgesia past initial 6 hours: 80 %  Ongoing improvement:  Analgesic:  80% Function: Ms. Victorino reports improvement in function ROM: Ms. Digilio reports improvement in ROM  Laboratory Chemistry Profile   Renal Lab Results  Component Value Date   BUN 19 06/25/2023   CREATININE 0.73 06/25/2023   BCR 26 06/25/2023    Hepatic Lab Results  Component Value Date   AST 24 06/25/2023   ALT 23 06/25/2023   ALBUMIN 4.1 06/25/2023   ALKPHOS 83 06/25/2023    Electrolytes Lab Results  Component Value Date   NA 139 06/25/2023   K 4.6 06/25/2023   CL 103 06/25/2023   CALCIUM  9.0 06/25/2023    Bone Lab Results  Component Value Date   VD25OH 50.7 06/25/2023    Inflammation (CRP: Acute Phase) (ESR: Chronic Phase) No results found for: CRP, ESRSEDRATE, LATICACIDVEN       Note: Above Lab results reviewed.  Imaging  DG Knee Complete 4 Views Left CLINICAL DATA:  Left knee pain/arthralgia  EXAM: LEFT KNEE - COMPLETE 4+ VIEW  COMPARISON:  None Available.  FINDINGS: No evidence of fracture, dislocation, or joint effusion. There is tricompartmental joint space narrowing, sclerosis and osteophytes consistent with degenerative joint disease. No evidence of effusion.  IMPRESSION: Degenerative changes.  Electronically Signed   By: Fonda Field M.D.   On: 06/30/2023 21:10  Assessment  The primary encounter diagnosis was Chronic pain of left knee. Diagnoses of Primary osteoarthritis of left knee and Chronic pain syndrome were also pertinent to this visit.  Plan of Care  The patient has left knee osteoarthritis and is demonstrating a favorable response to a series of intra-articular hyaluronic acid  (halogen) injections. She received her second injection on September 20, 2023, and reports noticeable improvement in pain and mobility, with greater ease in weight-bearing and ambulation. Given her positive response, I recommend continuing the current treatment plan with completion of injection number three prior to her upcoming [place] travel in early December to help maintain symptom control and joint function. She is also advised to utilize a supportive knee brace during prolonged standing or walking to optimize stability and reduce mechanical stress on the joint. Continue with gentle range-of-motion and strengthening exercises as tolerated. Follow-up will be planned after the third injection or sooner if symptoms recur or worsen.  Orders:  Orders Placed This Encounter  Procedures   KNEE INJECTION    Indications: Knee arthralgia (pain) due to osteoarthritis (OA) Imaging: None ([id number]) Position: Sitting Equipment/Materials: Block tray  1.5, 25-G (one per side)  Local anesthetic  Hyalgan (one per side)    Standing Status:   Future    Expected Date:   11/17/2023    Expiration Date:   10/18/2024    Scheduling Instructions:     Procedure: Knee injection Hyalgan (Hyaluronan/Hyaluronic acid)     Treatment No.:  3          Level: Intra-articular     Laterality: LEFT     Sedation: Patient's choice.    Where will this procedure be performed?:   [facility]   Follow-up plan:   Return in about 29 days (around 11/17/2023) for Left knee hyalgan #3.    Recent Visits Date Type  Provider Dept  09/20/23 Procedure visit Marcelino Nurse, MD [facility]  09/02/23 Office Visit Marcelino Nurse, MD [facility]  08/11/23 Procedure visit Marcelino Nurse, MD [facility]  08/05/23 Office Visit Marcelino Nurse, MD [facility]  Showing recent visits within past 90 days and meeting all other requirements Today's Visits Date Type Provider Dept  10/19/23 Office  Visit Marcelino Nurse, MD [facility]  Showing today's visits and meeting all other requirements Future Appointments No visits were found meeting these conditions. Showing future appointments within next 90 days and meeting all other requirements  I discussed the assessment and treatment plan with the patient. The patient was provided an opportunity to ask questions and all were answered. The patient agreed with the plan and demonstrated an understanding of the instructions.  Patient advised to call back or seek an in-person evaluation if the symptoms or condition worsens.  Duration of encounter: .  Note by: Nurse Marcelino, MD Date: 10/19/2023; Time: 2:59 PM

## 2023-10-19 NOTE — Telephone Encounter (Signed)
 Pt called requesting to start back on phentermine . Please advise.

## 2023-11-15 ENCOUNTER — Other Ambulatory Visit

## 2023-11-15 DIAGNOSIS — E66811 Obesity, class 1: Secondary | ICD-10-CM

## 2023-11-15 DIAGNOSIS — I152 Hypertension secondary to endocrine disorders: Secondary | ICD-10-CM

## 2023-11-15 DIAGNOSIS — E782 Mixed hyperlipidemia: Secondary | ICD-10-CM

## 2023-11-15 DIAGNOSIS — E1165 Type 2 diabetes mellitus with hyperglycemia: Secondary | ICD-10-CM

## 2023-11-15 DIAGNOSIS — E559 Vitamin D deficiency, unspecified: Secondary | ICD-10-CM

## 2023-11-16 ENCOUNTER — Ambulatory Visit: Payer: Self-pay | Admitting: Internal Medicine

## 2023-11-16 ENCOUNTER — Other Ambulatory Visit: Payer: Self-pay | Admitting: Student in an Organized Health Care Education/Training Program

## 2023-11-16 DIAGNOSIS — M1712 Unilateral primary osteoarthritis, left knee: Secondary | ICD-10-CM

## 2023-11-16 DIAGNOSIS — G8929 Other chronic pain: Secondary | ICD-10-CM

## 2023-11-16 LAB — TSH: TSH: 1.54 u[IU]/mL (ref 0.450–4.500)

## 2023-11-16 LAB — CMP14+EGFR
ALT: 20 IU/L (ref 0–32)
AST: 21 IU/L (ref 0–40)
Albumin: 4.5 g/dL (ref 3.9–4.9)
Alkaline Phosphatase: 80 IU/L (ref 49–135)
BUN/Creatinine Ratio: 19 (ref 12–28)
BUN: 17 mg/dL (ref 8–27)
Bilirubin Total: 0.5 mg/dL (ref 0.0–1.2)
CO2: 25 mmol/L (ref 20–29)
Calcium: 9.6 mg/dL (ref 8.7–10.3)
Chloride: 103 mmol/L (ref 96–106)
Creatinine, Ser: 0.88 mg/dL (ref 0.57–1.00)
Globulin, Total: 3 g/dL (ref 1.5–4.5)
Glucose: 126 mg/dL — ABNORMAL HIGH (ref 70–99)
Potassium: 4.4 mmol/L (ref 3.5–5.2)
Sodium: 143 mmol/L (ref 134–144)
Total Protein: 7.5 g/dL (ref 6.0–8.5)
eGFR: 73 mL/min/1.73 (ref >59)

## 2023-11-16 LAB — LIPID PANEL
Chol/HDL Ratio: 2.7 ratio (ref 0.0–4.4)
Cholesterol, Total: 130 mg/dL (ref 100–199)
HDL: 48 mg/dL (ref >39)
LDL Chol Calc (NIH): 56 mg/dL (ref 0–99)
Triglycerides: 153 mg/dL — ABNORMAL HIGH (ref 0–149)
VLDL Cholesterol Cal: 26 mg/dL (ref 5–40)

## 2023-11-16 LAB — HEMOGLOBIN A1C
Est. average glucose Bld gHb Est-mCnc: 151 mg/dL
Hgb A1c MFr Bld: 6.9 % — ABNORMAL HIGH (ref 4.8–5.6)

## 2023-11-16 LAB — VITAMIN D 25 HYDROXY (VIT D DEFICIENCY, FRACTURES): Vit D, 25-Hydroxy: 58.1 ng/mL (ref 30.0–100.0)

## 2023-11-16 NOTE — Progress Notes (Signed)
 Patient notified

## 2023-11-17 ENCOUNTER — Ambulatory Visit: Admitting: Student in an Organized Health Care Education/Training Program

## 2023-11-18 ENCOUNTER — Telehealth: Payer: Self-pay

## 2023-11-18 ENCOUNTER — Ambulatory Visit: Admitting: Internal Medicine

## 2023-11-18 ENCOUNTER — Ambulatory Visit: Payer: Self-pay | Admitting: Internal Medicine

## 2023-11-18 ENCOUNTER — Encounter: Payer: Self-pay | Admitting: Internal Medicine

## 2023-11-18 VITALS — BP 130/80 | HR 101 | Ht 63.0 in | Wt 182.2 lb

## 2023-11-18 DIAGNOSIS — E1159 Type 2 diabetes mellitus with other circulatory complications: Secondary | ICD-10-CM

## 2023-11-18 DIAGNOSIS — E1169 Type 2 diabetes mellitus with other specified complication: Secondary | ICD-10-CM

## 2023-11-18 DIAGNOSIS — E1165 Type 2 diabetes mellitus with hyperglycemia: Secondary | ICD-10-CM | POA: Diagnosis not present

## 2023-11-18 DIAGNOSIS — M5442 Lumbago with sciatica, left side: Secondary | ICD-10-CM

## 2023-11-18 DIAGNOSIS — K120 Recurrent oral aphthae: Secondary | ICD-10-CM | POA: Diagnosis not present

## 2023-11-18 DIAGNOSIS — E66811 Obesity, class 1: Secondary | ICD-10-CM

## 2023-11-18 DIAGNOSIS — E782 Mixed hyperlipidemia: Secondary | ICD-10-CM

## 2023-11-18 DIAGNOSIS — I152 Hypertension secondary to endocrine disorders: Secondary | ICD-10-CM

## 2023-11-18 LAB — POCT CBG (FASTING - GLUCOSE)-MANUAL ENTRY: Glucose Fasting, POC: 148 mg/dL — AB (ref 70–99)

## 2023-11-18 MED ORDER — BACLOFEN 10 MG PO TABS: 10.0000 mg | ORAL_TABLET | Freq: Every day | ORAL | 3 refills | Status: DC

## 2023-11-18 MED ORDER — NYSTATIN 100000 UNIT/ML MT SUSP: 5.0000 mL | Freq: Four times a day (QID) | OROMUCOSAL | 0 refills | Status: DC

## 2023-11-18 MED ORDER — NYSTATIN 100000 UNIT/ML MT SUSP: 5.0000 mL | Freq: Four times a day (QID) | OROMUCOSAL | 0 refills | Status: AC

## 2023-11-18 MED ORDER — GABAPENTIN 300 MG PO CAPS: 300.0000 mg | ORAL_CAPSULE | Freq: Three times a day (TID) | ORAL | 2 refills | Status: AC

## 2023-11-18 MED ORDER — BACLOFEN 10 MG PO TABS
10.0000 mg | ORAL_TABLET | Freq: Every day | ORAL | 3 refills | Status: AC
Start: 2023-11-18 — End: ?

## 2023-11-18 NOTE — Telephone Encounter (Signed)
 [facility] pharmacy called to inform you that they do not give people compounded medications so pt's magic mouthwash should be sent else where. They recommended [facility].

## 2023-11-18 NOTE — Progress Notes (Signed)
 Established Patient Office Visit  Subjective:  Patient ID: Sydney Torres, female    DOB: [birth year]  Age: 63 y.o. MRN: [id number]  Chief Complaint  Patient presents with   Follow-up    3 month follow up    Patient is here for her follow up. She had labs done earlier, results discussed today. Hgba1c is still at 6.9, trig are slightly high. Trying to control her diet. Takes Adipex 2-3 times /week, thinks it causes breakouts in her mouth. Will send in Magic mouth wash rx. Mentions pain in left knee, getting Synvisc injections- Takes Gabapentin  only at bedtime, some nights wakes up with discomfort in left leg. Advised to take her Baclofen  also at night- and can take Gabapentin  bid. Otherwise feels well. Will be travelling away for 2 months- needs refills.    No other concerns at this time.   Past Medical History:  Diagnosis Date   Arthritis    Depression    Diabetes (HCC)    Hyperlipemia    Hypertension    Urinary frequency     Past Surgical History:  Procedure Laterality Date   HERNIA REPAIR  2010    Social History   Socioeconomic History   Marital status: Married    Spouse name: Not on file   Number of children: Not on file   Years of education: Not on file   Highest education level: Not on file  Occupational History   Not on file  Tobacco Use   Smoking status: Never   Smokeless tobacco: Never  Substance and Sexual Activity   Alcohol use: No    Alcohol/week: 0.0 standard drinks of alcohol   Drug use: No   Sexual activity: Not on file  Other Topics Concern   Not on file  Social History Narrative   Not on file   Social Drivers of Health   Financial Resource Strain: Not on file  Food Insecurity: Not on file  Transportation Needs: Not on file  Physical Activity: Not on file  Stress: Not on file  Social Connections: Not on file  Intimate Partner Violence: Not on file    Family History  Problem Relation Age of Onset   Kidney failure Father    Kidney  failure Mother    Kidney failure Brother    Kidney cancer Neg Hx    Prostate cancer Neg Hx    Bladder Cancer Neg Hx    Breast cancer Neg Hx     No Known Allergies  Outpatient Medications Prior to Visit  Medication Sig   enalapril  (VASOTEC ) 10 MG tablet Take 1 tablet by mouth once daily   Iron-FA-B Cmp-C-Biot-Probiotic (FUSION PLUS) CAPS Take 1 capsule by mouth daily.   metFORMIN  (GLUCOPHAGE -XR) 500 MG 24 hr tablet Take 1 tablet (500 mg total) by mouth 2 (two) times daily with a meal.   phentermine  (ADIPEX-P ) 37.5 MG tablet Take 1 tablet (37.5 mg total) by mouth daily before breakfast.   rosuvastatin  (CRESTOR ) 20 MG tablet Take 1 tablet by mouth once daily   venlafaxine  XR (EFFEXOR -XR) 37.5 MG 24 hr capsule Take 1 capsule by mouth once daily   Vitamin D , Ergocalciferol , (DRISDOL ) 1.25 MG (50000 UNIT) CAPS capsule Take 1 capsule by mouth once a week   [DISCONTINUED] gabapentin  (NEURONTIN ) 300 MG capsule Take 1 capsule (300 mg total) by mouth 3 (three) times daily. (Patient taking differently: Take 300 mg by mouth at bedtime.)   [DISCONTINUED] baclofen  (LIORESAL ) 10 MG tablet TAKE 1 TABLET BY MOUTH  AT BEDTIME (Patient not taking: Reported on 11/18/2023)   No facility-administered medications prior to visit.    Review of Systems  Constitutional:  Positive for weight loss. Negative for chills, fever and malaise/fatigue.  HENT: Negative.  Negative for congestion and sore throat.   Eyes: Negative.  Negative for blurred vision and pain.  Respiratory: Negative.  Negative for cough and shortness of breath.   Cardiovascular: Negative.  Negative for chest pain, palpitations and leg swelling.  Gastrointestinal: Negative.  Negative for abdominal pain, blood in stool, constipation, diarrhea, heartburn, melena, nausea and vomiting.  Genitourinary: Negative.  Negative for dysuria, flank pain, frequency and urgency.  Musculoskeletal:  Positive for myalgias. Negative for joint pain.  Skin: Negative.    Neurological: Negative.  Negative for dizziness, tingling, sensory change, weakness and headaches.  Endo/Heme/Allergies: Negative.   Psychiatric/Behavioral: Negative.  Negative for depression and suicidal ideas. The patient is not nervous/anxious.        Objective:   BP 130/80   Pulse (!) 101   Ht 5' 3 (1.6 m)   Wt 182 lb 3.2 oz (82.6 kg)   SpO2 97%   BMI 32.28 kg/m   Vitals:   11/18/23 1007  BP: 130/80  Pulse: (!) 101  Height: 5' 3 (1.6 m)  Weight: 182 lb 3.2 oz (82.6 kg)  SpO2: 97%  BMI (Calculated): 32.28    Physical Exam Vitals and nursing note reviewed.  Constitutional:      Appearance: Normal appearance.  HENT:     Head: Normocephalic and atraumatic.     Nose: Nose normal.     Mouth/Throat:     Mouth: Mucous membranes are moist.     Pharynx: Oropharynx is clear.  Eyes:     Conjunctiva/sclera: Conjunctivae normal.     Pupils: Pupils are equal, round, and reactive to light.  Cardiovascular:     Rate and Rhythm: Normal rate and regular rhythm.     Pulses: Normal pulses.     Heart sounds: Normal heart sounds. No murmur heard. Pulmonary:     Effort: Pulmonary effort is normal.     Breath sounds: Normal breath sounds. No wheezing.  Abdominal:     General: Bowel sounds are normal.     Palpations: Abdomen is soft.     Tenderness: There is no abdominal tenderness. There is no right CVA tenderness or left CVA tenderness.  Musculoskeletal:        General: Normal range of motion.     Cervical back: Normal range of motion.     Right lower leg: No edema.     Left lower leg: No edema.  Skin:    General: Skin is warm and dry.  Neurological:     General: No focal deficit present.     Mental Status: She is alert and oriented to person, place, and time.  Psychiatric:        Mood and Affect: Mood normal.        Behavior: Behavior normal.      Results for orders placed or performed in visit on 11/18/23  POCT CBG (Fasting - Glucose)  Result Value Ref Range    Glucose Fasting, POC 148 (A) 70 - 99 mg/dL    Recent Results (from the past 2160 hours)  Hemoglobin A1c     Status: Abnormal   Collection Time: 11/15/23  9:41 AM  Result Value Ref Range   Hgb A1c MFr Bld 6.9 (H) 4.8 - 5.6 %    Comment:  Prediabetes: 5.7 - 6.4          Diabetes: >6.4          Glycemic control for adults with diabetes: <7.0    Est. average glucose Bld gHb Est-mCnc 151 mg/dL  TSH     Status: None   Collection Time: 11/15/23  9:41 AM  Result Value Ref Range   TSH 1.540 0.450 - 4.500 uIU/mL  CMP14+EGFR     Status: Abnormal   Collection Time: 11/15/23  9:41 AM  Result Value Ref Range   Glucose 126 (H) 70 - 99 mg/dL   BUN 17 8 - 27 mg/dL   Creatinine, Ser 9.11 0.57 - 1.00 mg/dL   eGFR 73 >40 fO/fpw/8.26   BUN/Creatinine Ratio 19 12 - 28   Sodium 143 134 - 144 mmol/L   Potassium 4.4 3.5 - 5.2 mmol/L   Chloride 103 96 - 106 mmol/L   CO2 25 20 - 29 mmol/L   Calcium  9.6 8.7 - 10.3 mg/dL   Total Protein 7.5 6.0 - 8.5 g/dL   Albumin 4.5 3.9 - 4.9 g/dL   Globulin, Total 3.0 1.5 - 4.5 g/dL   Bilirubin Total 0.5 0.0 - 1.2 mg/dL   Alkaline Phosphatase 80 49 - 135 IU/L   AST 21 0 - 40 IU/L   ALT 20 0 - 32 IU/L  Lipid panel     Status: Abnormal   Collection Time: 11/15/23  9:41 AM  Result Value Ref Range   Cholesterol, Total 130 100 - 199 mg/dL   Triglycerides 846 (H) 0 - 149 mg/dL   HDL 48 >60 mg/dL   VLDL Cholesterol Cal 26 5 - 40 mg/dL   LDL Chol Calc (NIH) 56 0 - 99 mg/dL   Chol/HDL Ratio 2.7 0.0 - 4.4 ratio    Comment:                                   T. Chol/HDL Ratio                                             Men  Women                               1/2 Avg.Risk  3.4    3.3                                   Avg.Risk  5.0    4.4                                2X Avg.Risk  9.6    7.1                                3X Avg.Risk 23.4   11.0   Vitamin D  (25 hydroxy)     Status: None   Collection Time: 11/15/23  9:41 AM  Result Value Ref Range   Vit D,  25-Hydroxy 58.1 30.0 - 100.0 ng/mL    Comment: Vitamin D  deficiency has been defined by the Institute  of Medicine and an Endocrine Society practice guideline as a level of serum 25-OH vitamin D  less than 20 ng/mL (1,2). The Endocrine Society went on to further define vitamin D  insufficiency as a level between 21 and 29 ng/mL (2). 1. IOM (Institute of Medicine). 2010. Dietary reference    intakes for calcium  and D. [place]: The    [employer]. 2. Holick MF, [place], Bischoff-Ferrari HA, et al.    Evaluation, treatment, and prevention of vitamin D     deficiency: an Endocrine Society clinical practice    guideline. JCEM. 2011 Jul; 96(7):1911-30.   POCT CBG (Fasting - Glucose)     Status: Abnormal   Collection Time: 11/18/23 10:13 AM  Result Value Ref Range   Glucose Fasting, POC 148 (A) 70 - 99 mg/dL      Assessment & Plan:  Continue meds  as advised and strict diet control. Problem List Items Addressed This Visit     Type 2 diabetes mellitus with hyperglycemia, without long-term current use of insulin (HCC) - Primary   Relevant Orders   POCT CBG (Fasting - Glucose) (Completed)   Obesity (BMI 30.0-34.9)   Hypertension associated with diabetes (HCC)   Combined hyperlipidemia associated with type 2 diabetes mellitus (HCC)   Low back pain with left-sided sciatica   Relevant Medications   gabapentin  (NEURONTIN ) 300 MG capsule   baclofen  (LIORESAL ) 10 MG tablet   magic mouthwash (nystatin, hydrocortisone, diphenhydrAMINE, lidocaine ) suspension   Other Visit Diagnoses       Aphthous ulcer of mouth       Relevant Medications   magic mouthwash (nystatin, hydrocortisone, diphenhydrAMINE, lidocaine ) suspension       Return in about 3 months (around 02/18/2024).   Total time spent: 30 minutes. This time includes review of previous notes and results and patient face to face interaction during today's visit.    FERNAND FREDY RAMAN, MD  11/18/2023   This document  may have been prepared by [facility] Voice Recognition software and as such may include unintentional dictation errors.

## 2023-11-19 ENCOUNTER — Other Ambulatory Visit: Payer: Self-pay | Admitting: Internal Medicine

## 2023-11-19 DIAGNOSIS — E1165 Type 2 diabetes mellitus with hyperglycemia: Secondary | ICD-10-CM

## 2023-11-23 ENCOUNTER — Encounter: Payer: Self-pay | Admitting: Student in an Organized Health Care Education/Training Program

## 2023-11-23 ENCOUNTER — Ambulatory Visit
Attending: Student in an Organized Health Care Education/Training Program | Admitting: Student in an Organized Health Care Education/Training Program

## 2023-11-23 DIAGNOSIS — M25562 Pain in left knee: Secondary | ICD-10-CM | POA: Diagnosis present

## 2023-11-23 DIAGNOSIS — G894 Chronic pain syndrome: Secondary | ICD-10-CM | POA: Diagnosis present

## 2023-11-23 DIAGNOSIS — M1712 Unilateral primary osteoarthritis, left knee: Secondary | ICD-10-CM | POA: Insufficient documentation

## 2023-11-23 DIAGNOSIS — G8929 Other chronic pain: Secondary | ICD-10-CM | POA: Diagnosis present

## 2023-11-23 MED ORDER — SODIUM HYALURONATE (VISCOSUP) 20 MG/2ML IX SOSY
2.0000 mL | PREFILLED_SYRINGE | Freq: Once | INTRA_ARTICULAR | Status: AC
  Administered 2023-11-23: 20 mg via INTRA_ARTICULAR

## 2023-11-23 MED ORDER — LIDOCAINE HCL 2 % IJ SOLN
20.0000 mL | Freq: Once | INTRAMUSCULAR | Status: AC
  Administered 2023-11-23: 100 mg

## 2023-11-23 MED ORDER — LIDOCAINE HCL (PF) 2 % IJ SOLN
INTRAMUSCULAR | Status: AC
  Filled 2023-11-23: qty 5

## 2023-11-23 NOTE — Patient Instructions (Signed)

## 2023-11-23 NOTE — Telephone Encounter (Signed)
 Line cut out

## 2023-11-23 NOTE — Progress Notes (Signed)
 PROVIDER NOTE: Interpretation of information contained herein should be left to medically-trained personnel. Specific patient instructions are provided elsewhere under Patient Instructions section of medical record. This document was created in part using STT-dictation technology, any transcriptional errors that may result from this process are unintentional.  Patient: Sydney Torres Type: Established DOB: 1960/09/19 MRN: [id number] PCP: Bathe Fredy RAMAN, MD  Service: Procedure DOS: 11/23/2023 Setting: Ambulatory Location: Ambulatory outpatient facility Delivery: Face-to-face Provider: Wallie Sherry, MD Specialty: Interventional Pain Management Specialty designation: 09 Location: Outpatient facility Ref. Prov.: Sherry Wallie, MD       Interventional Therapy   Type:  Hyalgan Intra-articular Knee Injection #3  Laterality: Left (-LT) Level/approach: Medial Imaging guidance: None required ([id number]) Anesthesia: Local anesthesia (1-2% Lidocaine ) DOS: 11/23/2023  Performed by: Wallie Sherry, MD  Purpose: Diagnostic/Therapeutic Indications: Knee arthralgia associated to osteoarthritis of the knee 1. Chronic pain of left knee   2. Primary osteoarthritis of left knee   3. Chronic pain syndrome     NAS-11 score:   Pre-procedure: 0-No pain/10   Post-procedure: 0-No pain/10     Pre-Procedure Preparation  Monitoring: As per clinic protocol.  Risk Assessment: Vitals:  AFP:Zdupfjuzi body mass index is 38.04 kg/m as calculated from the following:   Height as of this encounter: 4' 10 (1.473 m).   Weight as of this encounter: 182 lb (82.6 kg)., Rate:(!) 101 , BP:132/68, Resp:16, Temp:97.8 F (36.6 C), SpO2:97 %  Allergies: She has no known allergies.  Precautions: No additional precautions required  Blood-thinner(s): None at this time  Coagulopathies: Reviewed. None identified.   Active Infection(s): Reviewed. None identified. Sydney Torres is afebrile   Location setting: Exam room Position:  Sitting w/ knee bent 90 degrees Safety Precautions: Patient was assessed for positional comfort and pressure points before starting the procedure. Prepping solution: DuraPrep (Iodine Povacrylex [0.7% available iodine] and Isopropyl Alcohol, 74% w/w) Prep Area: Entire knee region Approach: percutaneous, just above the tibial plateau, lateral to the infrapatellar tendon. Intended target: Intra-articular knee space Materials: Tray: Block Needle(s): Regular Qty: 1/side Length: 1.5-inch Gauge: 25G (x1) + 22G (x1)  2 cc of fluid drained before injection  Meds ordered this encounter  Medications   lidocaine  (XYLOCAINE ) 2 % (with pres) injection 400 mg   Sodium Hyaluronate (Viscosup) SOSY 20 mg    Do not substitute. Deliver to facility day before procedure.    No orders of the defined types were placed in this encounter.    Time-out: 1323 I initiated and conducted the Time-out before starting the procedure, as per protocol. The patient was asked to participate by confirming the accuracy of the Time Out information. Verification of the correct person, site, and procedure were performed and confirmed by me, the nursing staff, and the patient. Time-out conducted as per Joint Commission's Universal Protocol (UP.01.01.01). Procedure checklist: Completed  H&P (Pre-op  Assessment)  Sydney Torres is a 63 y.o. (year old), female patient, seen today for interventional treatment. She  has a past surgical history that includes Hernia repair (2010). Sydney Torres has a current medication list which includes the following prescription(s): baclofen , enalapril , gabapentin , fusion plus, magic mouthwash (nystatin, hydrocortisone, diphenhydrAMINE, lidocaine ) suspension, metformin , phentermine , rosuvastatin , venlafaxine  xr, and vitamin d  (ergocalciferol ). Her primarily concern today is the Knee Pain (Left ) and Wrist Pain (Thumb area )  She has no known allergies.   Last encounter: My last encounter with her was on  11/16/2023. Pertinent problems: Sydney Torres does not have any pertinent problems on file. Pain Assessment: Severity of  Chronic pain is reported as a 0-No pain/10. Location: Knee (left leg pain in the calf area) Left/? into popliteal area vs upper calf area. Onset: More than a month ago. Quality: Discomfort. Timing: Intermittent. Modifying factor(s): rest. Vitals:  height is 4' 10 (1.473 m) and weight is 182 lb (82.6 kg). Her temporal temperature is 97.8 F (36.6 C). Her blood pressure is 132/68 and her pulse is 101 (abnormal). Her respiration is 16 and oxygen saturation is 97%.   Reason for encounter: interventional pain management therapy due pain of at least four (4) weeks in duration, with failure to respond and/or inability to tolerate more conservative care.  Site Confirmation: Sydney Torres was asked to confirm the procedure and laterality before marking the site.  Consent: Before the procedure and under the influence of no sedative(s), amnesic(s), or anxiolytics, the patient was informed of the treatment options, risks and possible complications. To fulfill our ethical and legal obligations, as recommended by the American Medical Association's Code of Ethics, I have informed the patient of my clinical impression; the nature and purpose of the treatment or procedure; the risks, benefits, and possible complications of the intervention; the alternatives, including doing nothing; the risk(s) and benefit(s) of the alternative treatment(s) or procedure(s); and the risk(s) and benefit(s) of doing nothing. The patient was provided information about the general risks and possible complications associated with the procedure. These may include, but are not limited to: failure to achieve desired goals, infection, bleeding, organ or nerve damage, allergic reactions, paralysis, and death. In addition, the patient was informed of those risks and complications associated to Spine-related procedures, such as failure to  decrease pain; infection (i.e.: Meningitis, epidural or intraspinal abscess); bleeding (i.e.: epidural hematoma, subarachnoid hemorrhage, or any other type of intraspinal or peri-dural bleeding); organ or nerve damage (i.e.: Any type of peripheral nerve, nerve root, or spinal cord injury) with subsequent damage to sensory, motor, and/or autonomic systems, resulting in permanent pain, numbness, and/or weakness of one or several areas of the body; allergic reactions; (i.e.: anaphylactic reaction); and/or death. Furthermore, the patient was informed of those risks and complications associated with the medications. These include, but are not limited to: allergic reactions (i.e.: anaphylactic or anaphylactoid reaction(s)); adrenal axis suppression; blood sugar elevation that in diabetics may result in ketoacidosis or comma; water retention that in patients with history of congestive heart failure may result in shortness of breath, pulmonary edema, and decompensation with resultant heart failure; weight gain; swelling or edema; medication-induced neural toxicity; particulate matter embolism and blood vessel occlusion with resultant organ, and/or nervous system infarction; and/or aseptic necrosis of one or more joints. Finally, the patient was informed that Medicine is not an exact science; therefore, there is also the possibility of unforeseen or unpredictable risks and/or possible complications that may result in a catastrophic outcome. The patient indicated having understood very clearly. We have given the patient no guarantees and we have made no promises. Enough time was given to the patient to ask questions, all of which were answered to the patient's satisfaction. Sydney Torres has indicated that she wanted to continue with the procedure. Attestation: I, the ordering provider, attest that I have discussed with the patient the benefits, risks, side-effects, alternatives, likelihood of achieving goals, and potential  problems during recovery for the procedure that I have provided informed consent.  Date  Time: 11/23/2023 12:34 PM  Description of procedure  Start Time: 1323 hrs  Local Anesthesia: Once the patient was positioned, prepped, and time-out was  completed. The target area was identified located. The skin was marked with an approved surgical skin marker. Once marked, the skin (epidermis, dermis, and hypodermis), and deeper tissues (fat, connective tissue and muscle) were infiltrated with a small amount of a short-acting local anesthetic, loaded on a 10cc syringe with a 25G, 1.5-in  Needle. An appropriate amount of time was allowed for local anesthetics to take effect before proceeding to the next step. Local Anesthetic: Lidocaine  1-2% The unused portion of the local anesthetic was discarded in the proper designated containers. Safety Precautions: Aspiration looking for blood return was conducted prior to all injections. At no point did I inject any substances, as a needle was being advanced. Before injecting, the patient was told to immediately notify me if she was experiencing any new onset of ringing in the ears, or metallic taste in the mouth. No attempts were made at seeking any paresthesias. Safe injection practices and needle disposal techniques used. Medications properly checked for expiration dates. SDV (single dose vial) medications used. After the completion of the procedure, all disposable equipment used was discarded in the proper designated medical waste containers.  Technical description: Protocol guidelines were followed. After positioning, the target area was identified and prepped in the usual manner. Skin & deeper tissues infiltrated with local anesthetic. Appropriate amount of time allowed to pass for local anesthetics to take effect. Proper needle placement secured. Once satisfactory needle placement was confirmed, I proceeded to inject the desired solution in slow, incremental fashion,  intermittently assessing for discomfort or any signs of abnormal or undesired spread of substance. Once completed, the needle was removed and disposed of, as per hospital protocols. The area was cleaned, making sure to leave some of the prepping solution back to take advantage of its long term bactericidal properties.  Aspiration:  Negative        Vitals:   11/23/23 1305  BP: 132/68  Pulse: (!) 101  Resp: 16  Temp: 97.8 F (36.6 C)  TempSrc: Temporal  SpO2: 97%  Weight: 182 lb (82.6 kg)  Height: 4' 10 (1.473 m)    End Time: 1325 hrs  Imaging guidance  Imaging-assisted Technique: None required. Indication(s): N/A Exposure Time: N/A Contrast: None Fluoroscopic Guidance: N/A Ultrasound Guidance: N/A Interpretation: N/A  Post-op assessment  Post-procedure Vital Signs:  Pulse/HCG Rate: (!) 101  Temp: 97.8 F (36.6 C) Resp: 16 BP: 132/68 SpO2: 97 %  EBL: None  Complications: No immediate post-treatment complications observed by team, or reported by patient.  Note: The patient tolerated the entire procedure well. A repeat set of vitals were taken after the procedure and the patient was kept under observation following institutional policy, for this type of procedure. Post-procedural neurological assessment was performed, showing return to baseline, prior to discharge. The patient was provided with post-procedure discharge instructions, including a section on how to identify potential problems. Should any problems arise concerning this procedure, the patient was given instructions to immediately contact us , at any time, without hesitation. In any case, we plan to contact the patient by telephone for a follow-up status report regarding this interventional procedure.  Comments:  No additional relevant information.  Plan of care Requested Prescriptions    No prescriptions requested or ordered in this encounter     Medications administered: We administered lidocaine  and  Sodium Hyaluronate (Viscosup).   Follow-up plan:   Return if symptoms worsen or fail to improve.     Recent Visits Date Type Provider Dept  10/19/23 Office Visit Marcelino Nurse,  MD [facility]  09/20/23 Procedure visit Marcelino Nurse, MD [facility]  09/02/23 Office Visit Marcelino Nurse, MD [facility]  Showing recent visits within past 90 days and meeting all other requirements Today's Visits Date Type Provider Dept  11/23/23 Procedure visit Marcelino Nurse, MD [facility]  Showing today's visits and meeting all other requirements Future Appointments No visits were found meeting these conditions. Showing future appointments within next 90 days and meeting all other requirements   Disposition: Discharge home  Discharge (Date  Time): 11/23/2023; 1335 hrs.   Primary Care Physician: Fernand Fredy RAMAN, MD Location: [facility] Note by: Nurse Marcelino, MD Date: 11/23/2023; Time: 2:10 PM  DISCLAIMER: Medicine is not an visual merchandiser. It has no guarantees or warranties. The decision to proceed with this intervention was based on the information collected from the patient. Conclusions were drawn from the patient's questionnaire, interview, and examination. Because information was provided in large part by the patient, it cannot be guaranteed that it has not been purposely or unconsciously manipulated or altered. Every effort has been made to obtain as much accurate, relevant, available data as possible. Always take into account that the treatment will also be dependent on availability of resources and existing treatment guidelines, considered by other Pain Management Specialists as being common knowledge and practice, at the time of the intervention. It is also important to point out that variation in procedural techniques and pharmacological choices are the acceptable norm. For Medico-Legal review purposes, the indications,  contraindications, technique, and results of the these procedures should only be evaluated, judged and interpreted by a Board-Certified Interventional Pain Specialist with extensive familiarity and expertise in the same exact procedure and technique.

## 2023-11-23 NOTE — Progress Notes (Signed)
 Safety precautions to be maintained throughout the outpatient stay will include: orient to surroundings, keep bed in low position, maintain call bell within reach at all times, provide assistance with transfer out of bed and ambulation.

## 2023-11-24 ENCOUNTER — Telehealth: Payer: Self-pay | Admitting: *Deleted

## 2023-11-24 NOTE — Telephone Encounter (Signed)
 Post  procedure call; reports that she had some increased pain last night and has not been up very long this morning.  Asked her to please keep up with progress on pain diary. Patient verbalizes u/o information.

## 2024-01-15 ENCOUNTER — Other Ambulatory Visit: Payer: Self-pay | Admitting: Internal Medicine

## 2024-01-15 DIAGNOSIS — D508 Other iron deficiency anemias: Secondary | ICD-10-CM

## 2024-02-09 ENCOUNTER — Other Ambulatory Visit: Payer: Self-pay | Admitting: *Deleted

## 2024-02-09 DIAGNOSIS — G8929 Other chronic pain: Secondary | ICD-10-CM

## 2024-02-09 DIAGNOSIS — M1712 Unilateral primary osteoarthritis, left knee: Secondary | ICD-10-CM

## 2024-02-09 MED ORDER — DICLOFENAC SODIUM 75 MG PO TBEC: DELAYED_RELEASE_TABLET | ORAL | 0 refills | Status: AC

## 2024-02-09 NOTE — Progress Notes (Unsigned)
 Safety precautions to be maintained throughout the outpatient stay will include: orient to surroundings, keep bed in low position, maintain call bell within reach at all times, provide assistance with transfer out of bed and ambulation.

## 2024-02-11 ENCOUNTER — Telehealth: Payer: Self-pay | Admitting: Internal Medicine

## 2024-02-11 NOTE — Telephone Encounter (Signed)
 PT LVM wanting return call back

## 2024-02-18 ENCOUNTER — Ambulatory Visit: Payer: Self-pay | Admitting: Internal Medicine
